# Patient Record
Sex: Male | Born: 1985 | Race: White | Hispanic: No | Marital: Single | Smoking: Never smoker
Health system: Southern US, Community
[De-identification: ages and names within clinical notes are randomized; demographics above are authoritative.]

## PROBLEM LIST (undated history)

## (undated) DIAGNOSIS — M24411 Recurrent dislocation, right shoulder: Secondary | ICD-10-CM

## (undated) DIAGNOSIS — F419 Anxiety disorder, unspecified: Secondary | ICD-10-CM

## (undated) DIAGNOSIS — I10 Essential (primary) hypertension: Secondary | ICD-10-CM

## (undated) DIAGNOSIS — F1910 Other psychoactive substance abuse, uncomplicated: Secondary | ICD-10-CM

## (undated) DIAGNOSIS — Z87442 Personal history of urinary calculi: Secondary | ICD-10-CM

## (undated) DIAGNOSIS — K219 Gastro-esophageal reflux disease without esophagitis: Secondary | ICD-10-CM

## (undated) DIAGNOSIS — M24111 Other articular cartilage disorders, right shoulder: Secondary | ICD-10-CM

## (undated) DIAGNOSIS — M75111 Incomplete rotator cuff tear or rupture of right shoulder, not specified as traumatic: Secondary | ICD-10-CM

## (undated) DIAGNOSIS — S43431A Superior glenoid labrum lesion of right shoulder, initial encounter: Secondary | ICD-10-CM

## (undated) HISTORY — PX: TONSILLECTOMY: SHX5217

## (undated) HISTORY — DX: Gastro-esophageal reflux disease without esophagitis: K21.9

## (undated) HISTORY — DX: Anxiety disorder, unspecified: F41.9

## (undated) HISTORY — DX: Other psychoactive substance abuse, uncomplicated: F19.10

## (undated) HISTORY — DX: Essential (primary) hypertension: I10

---
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
## 2011-01-04 ENCOUNTER — Ambulatory Visit (INDEPENDENT_AMBULATORY_CARE_PROVIDER_SITE_OTHER): Payer: 59

## 2011-01-04 ENCOUNTER — Inpatient Hospital Stay (INDEPENDENT_AMBULATORY_CARE_PROVIDER_SITE_OTHER)
Admission: RE | Admit: 2011-01-04 | Discharge: 2011-01-04 | Disposition: A | Discharge status: Home / Self Care | Payer: 59 | Source: Ambulatory Visit | Attending: Family Medicine | Admitting: Family Medicine

## 2011-01-04 DIAGNOSIS — N201 Calculus of ureter: Secondary | ICD-10-CM

## 2011-01-04 LAB — POCT URINALYSIS DIP (DEVICE)
Bilirubin Urine: NEGATIVE
Glucose, UA: NEGATIVE mg/dL
Ketones, ur: NEGATIVE mg/dL
Nitrite: NEGATIVE
Protein, ur: NEGATIVE mg/dL
Specific Gravity, Urine: 1.02 (ref 1.005–1.030)
Urobilinogen, UA: 0.2 mg/dL (ref 0.0–1.0)
pH: 6.5 (ref 5.0–8.0)

## 2011-08-23 ENCOUNTER — Other Ambulatory Visit: Payer: Self-pay

## 2011-08-23 ENCOUNTER — Emergency Department (HOSPITAL_COMMUNITY)
Admission: EM | Admit: 2011-08-23 | Discharge: 2011-08-24 | Payer: 59 | Attending: Emergency Medicine | Admitting: Emergency Medicine

## 2011-08-23 ENCOUNTER — Encounter: Payer: Self-pay | Admitting: Adult Health

## 2011-08-23 ENCOUNTER — Emergency Department (HOSPITAL_COMMUNITY): Payer: 59

## 2011-08-23 DIAGNOSIS — Z87442 Personal history of urinary calculi: Secondary | ICD-10-CM | POA: Insufficient documentation

## 2011-08-23 DIAGNOSIS — S60229A Contusion of unspecified hand, initial encounter: Secondary | ICD-10-CM

## 2011-08-23 DIAGNOSIS — Z79899 Other long term (current) drug therapy: Secondary | ICD-10-CM | POA: Insufficient documentation

## 2011-08-23 DIAGNOSIS — IMO0002 Reserved for concepts with insufficient information to code with codable children: Secondary | ICD-10-CM | POA: Insufficient documentation

## 2011-08-23 LAB — URINALYSIS, ROUTINE W REFLEX MICROSCOPIC
Glucose, UA: NEGATIVE mg/dL
Ketones, ur: NEGATIVE mg/dL
Leukocytes, UA: NEGATIVE
Protein, ur: NEGATIVE mg/dL
Urobilinogen, UA: 1 mg/dL (ref 0.0–1.0)

## 2011-08-23 LAB — ACETAMINOPHEN LEVEL: Acetaminophen (Tylenol), Serum: <15 ug/mL (ref 10–30)

## 2011-08-23 LAB — COMPREHENSIVE METABOLIC PANEL
ALT: 38 U/L (ref 0–53)
AST: 20 U/L (ref 0–37)
Albumin: 4.2 g/dL (ref 3.5–5.2)
Alkaline Phosphatase: 91 U/L (ref 39–117)
CO2: 25 mEq/L (ref 19–32)
Chloride: 103 mEq/L (ref 96–112)
Creatinine, Ser: 0.83 mg/dL (ref 0.50–1.35)
Potassium: 3.6 mEq/L (ref 3.5–5.1)
Sodium: 138 mEq/L (ref 135–145)
Total Bilirubin: 0.2 mg/dL — ABNORMAL LOW (ref 0.3–1.2)

## 2011-08-23 LAB — RAPID URINE DRUG SCREEN, HOSP PERFORMED
Amphetamines: NOT DETECTED
Benzodiazepines: NOT DETECTED
Tetrahydrocannabinol: POSITIVE — AB

## 2011-08-23 LAB — CBC
Platelets: 202 10*3/uL (ref 150–400)
RBC: 4.98 MIL/uL (ref 4.22–5.81)
RDW: 13.8 % (ref 11.5–15.5)
WBC: 10.1 10*3/uL (ref 4.0–10.5)

## 2011-08-23 NOTE — ED Notes (Signed)
Sent from [facility] with orders for hand x ray, and med clearance. To go back to [facility] after medically cleared. IVC papers.

## 2011-08-23 NOTE — ED Provider Notes (Signed)
History     CSN: [id number] Arrival date & time: 08/23/2011  9:02 PM   First MD Initiated Contact with Patient 08/23/11 2232      Chief Complaint  Patient presents with  . Medical Clearance  . Hand Pain    (Consider location/radiation/quality/duration/timing/severity/associated sxs/prior treatment) HPI Sent from [facility] with orders for hand x ray, and med clearance. To go back to [facility] after medically cleared. IVC papers.    Past Medical History  Diagnosis Date  . Kidney stone     History reviewed. No pertinent past surgical history.  History reviewed. No pertinent family history.  History  Substance Use Topics  . Smoking status: Never Smoker   . Smokeless tobacco: Not on file  . Alcohol Use: Yes      Review of Systems  Constitutional: Negative.   HENT: Negative.   Eyes: Negative.   Cardiovascular: Negative.   Gastrointestinal: Negative.   Hematological: Negative.     Allergies  Review of patient's allergies indicates no known allergies.  Home Medications   Current Outpatient Rx  Name Route Sig Dispense Refill  . ACETAMINOPHEN 325 MG PO TABS Oral Take 650 mg by mouth every 6 (six) hours as needed.      . GUAIFENESIN 600 MG PO TB12 Oral Take 600 mg by mouth 2 (two) times daily.        BP 152/81  Pulse 84  Temp(Src) 97.9 F (36.6 C) (Oral)  Resp 20  SpO2 98%  Physical Exam  Nursing note and vitals reviewed. Constitutional: He is oriented to person, place, and time. He appears well-developed and well-nourished. No distress.  HENT:  Head: Normocephalic and atraumatic.  Eyes: Pupils are equal, round, and reactive to light.  Neck: Normal range of motion.  Cardiovascular: Normal rate and intact distal pulses.   Pulmonary/Chest: No respiratory distress.  Abdominal: Normal appearance. He exhibits no distension.  Musculoskeletal: Normal range of motion.  Neurological: He is alert and oriented to person, place, and time. No cranial nerve deficit.    Skin: Skin is warm and dry. No rash noted.  Psychiatric: Judgment normal.    ED Course  Procedures (including critical care time)  Labs Reviewed  COMPREHENSIVE METABOLIC PANEL - Abnormal; Notable for the following:    Total Bilirubin 0.2 (*)    All other components within normal limits  URINE RAPID DRUG SCREEN (HOSP PERFORMED) - Abnormal; Notable for the following:    Tetrahydrocannabinol POSITIVE (*)    All other components within normal limits  URINALYSIS, ROUTINE W REFLEX MICROSCOPIC - Abnormal; Notable for the following:    Appearance CLOUDY (*)    All other components within normal limits  CBC  ETHANOL  ACETAMINOPHEN LEVEL   *RADIOLOGY REPORT*  Clinical Data: Hand struck wall, pain  RIGHT HAND - COMPLETE 3+ VIEW  Comparison: None  Findings: Small benign-appearing sclerotic focus at base of proximal phalanx index finger, question bone island. Osseous mineralization otherwise normal. Soft tissue swelling overlies the dorsum of the hand at the level of the mid to distal metacarpals. No acute fracture, dislocation, or bone destruction.  IMPRESSION: No acute osseous abnormalities.      1. Hand contusion       MDM          Nelia Shi, MD 08/24/11 [id number]

## 2011-08-23 NOTE — ED Notes (Signed)
Dr. Radford Pax to come see pt.

## 2012-03-28 IMAGING — CR DG HAND COMPLETE 3+V*R*
3 series · 3 of 3 positions shown · non-contrast
Comparison: None

CLINICAL DATA: Hand struck wall, pain

RIGHT HAND - COMPLETE 3+ VIEW

[x hand pa right]
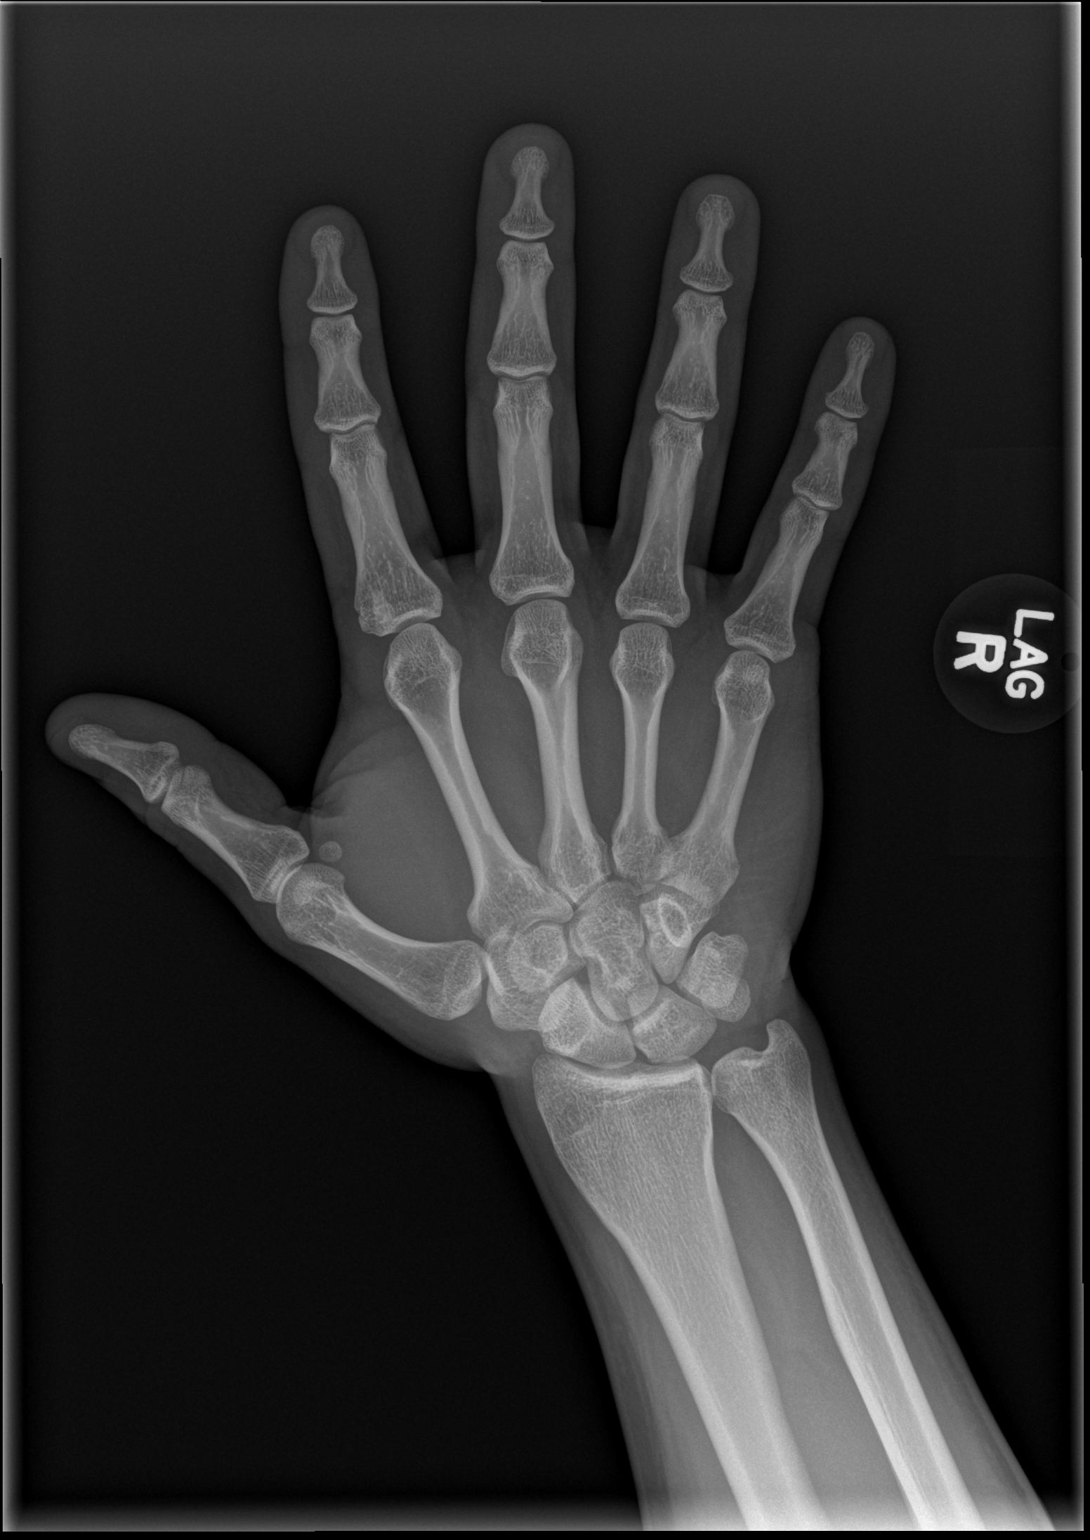

[x hand obl right]
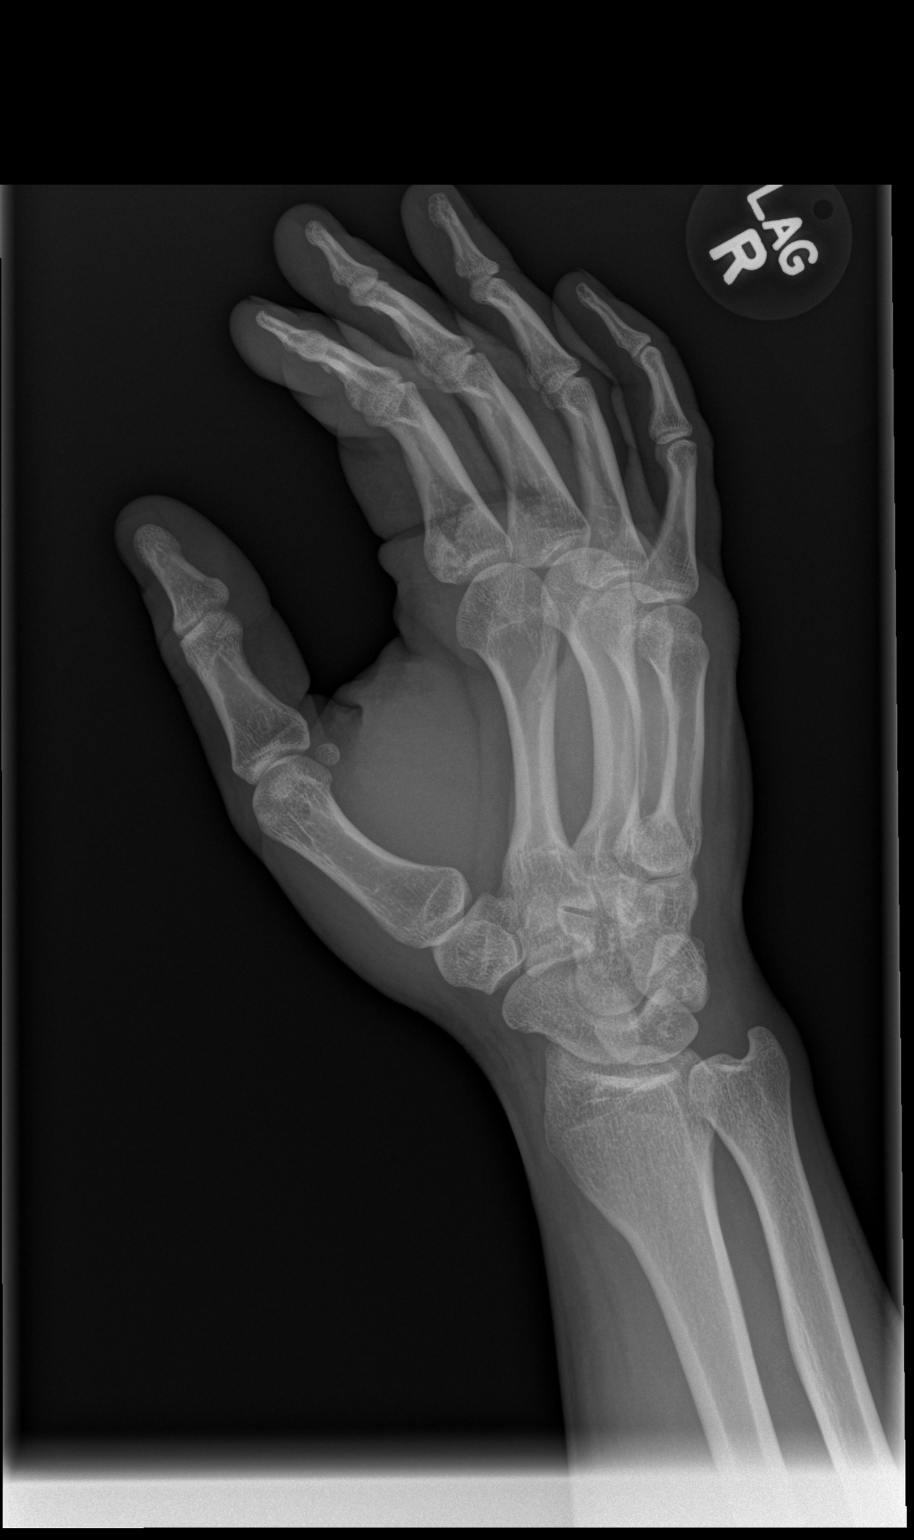

[x hand lat right]
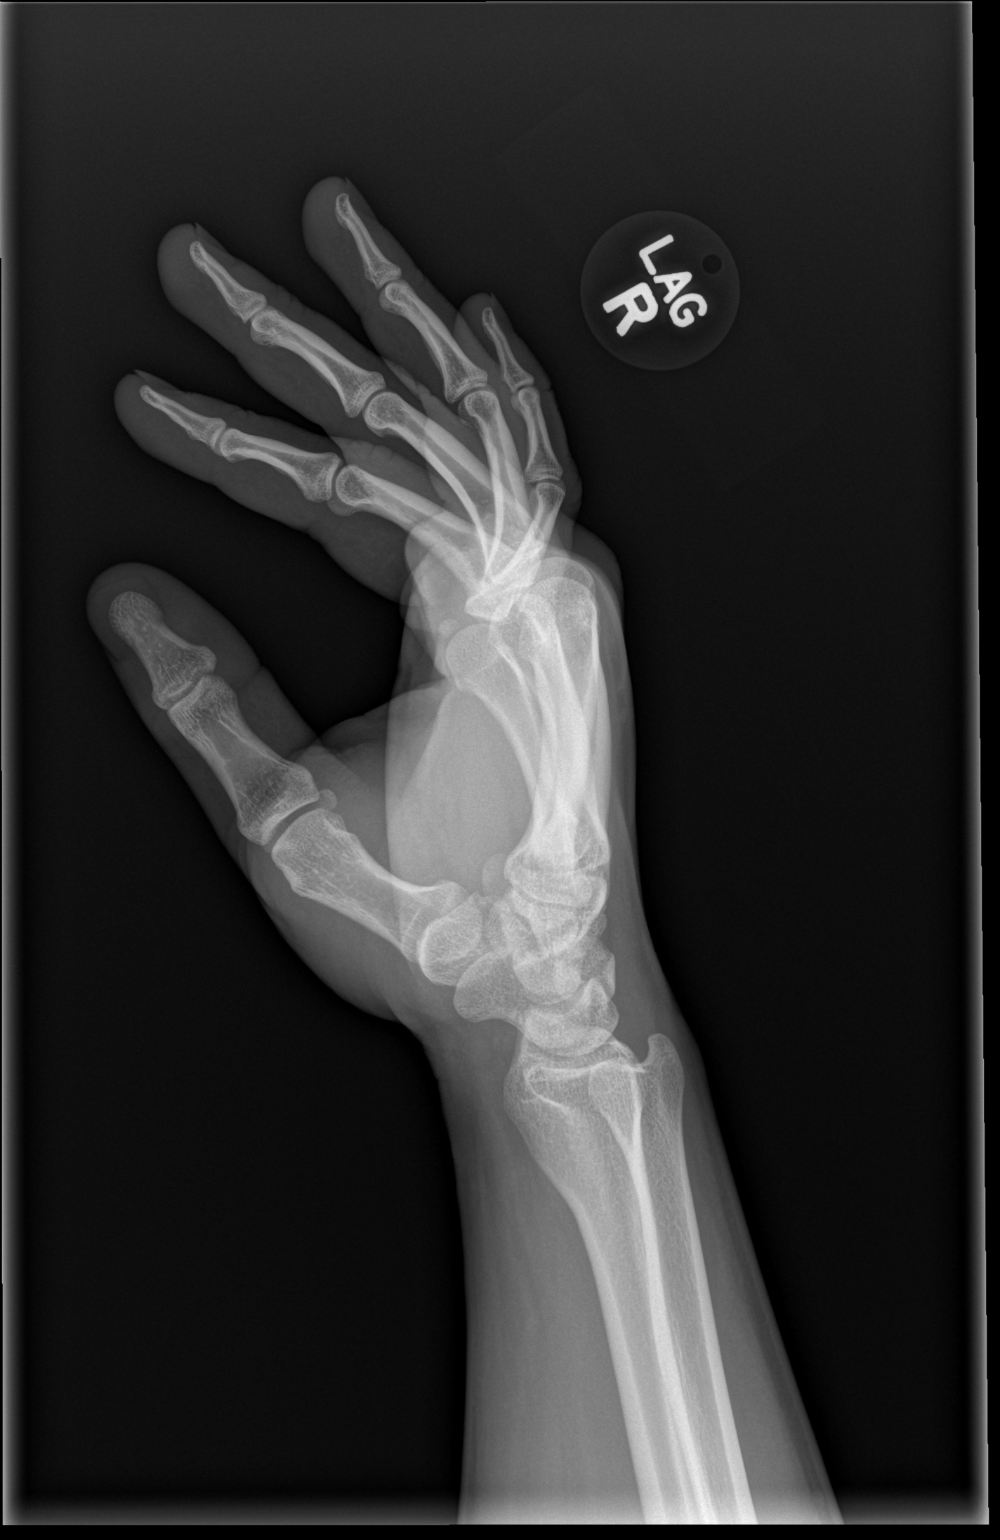

[3 of 3 positions shown; findings below may reference images not displayed]

FINDINGS: Small benign-appearing sclerotic focus at base of proximal phalanx
index finger, question bone island.
Osseous mineralization otherwise normal.
Soft tissue swelling overlies the dorsum of the hand at the level
of the mid to distal metacarpals.
No acute fracture, dislocation, or bone destruction.
IMPRESSION: No acute osseous abnormalities.

## 2017-08-30 DIAGNOSIS — M24411 Recurrent dislocation, right shoulder: Secondary | ICD-10-CM | POA: Diagnosis present

## 2017-08-30 DIAGNOSIS — M24111 Other articular cartilage disorders, right shoulder: Secondary | ICD-10-CM

## 2017-08-30 HISTORY — DX: Recurrent dislocation, right shoulder: M24.411

## 2017-08-30 HISTORY — DX: Other articular cartilage disorders, right shoulder: M24.111

## 2017-09-26 ENCOUNTER — Other Ambulatory Visit: Payer: Self-pay

## 2017-09-26 ENCOUNTER — Encounter (HOSPITAL_BASED_OUTPATIENT_CLINIC_OR_DEPARTMENT_OTHER): Payer: Self-pay | Admitting: *Deleted

## 2017-10-02 ENCOUNTER — Encounter (HOSPITAL_BASED_OUTPATIENT_CLINIC_OR_DEPARTMENT_OTHER): Payer: Self-pay | Admitting: Physician Assistant

## 2017-10-02 DIAGNOSIS — M75111 Incomplete rotator cuff tear or rupture of right shoulder, not specified as traumatic: Secondary | ICD-10-CM | POA: Diagnosis present

## 2017-10-02 DIAGNOSIS — S43431A Superior glenoid labrum lesion of right shoulder, initial encounter: Secondary | ICD-10-CM | POA: Diagnosis present

## 2017-10-02 NOTE — H&P (Signed)
William Villa is an 32 y.o. male.   Chief Complaint: right shoulder labral tear HPI:  Mr. William Villa is seen for a followup for significant right shoulder pain.  He saw Dr. Charlett BlakeVoytek ten days ago for significant right shoulder pain and a hand injury, but the shoulder has been bothering him for the past 8-10 months with repetitive lifting and pulling.  He is having sharp, stabbing pain in the shoulder.  He has tried rest and anti-inflammatories.  He has done a rehab program.  Despite this he has continued to have pain.  He underwent an MRI arthrogram of the right shoulder on 09/10/2017 that revealed a SLAP tear, as well as rotator cuff partial tear.  He has failed all conservative care.    Past Medical History:  Diagnosis Date  . 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 History of kidney stones   . 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 TONSILLECTOMY      History reviewed. No pertinent family history. Social History:  reports that  has never smoked. he has never used smokeless tobacco. He reports that he drinks alcohol. He reports that he uses drugs. Drug: Marijuana.  Allergies: No Known Allergies  No medications prior to admission.    No results found for this or any previous visit (from the past 48 hour(s)). No results found.  Review of Systems  Constitutional: Negative.   HENT: Negative.   Eyes: Negative.   Respiratory: Negative.   Cardiovascular: Negative.   Gastrointestinal: Negative.   Genitourinary: Negative.   Musculoskeletal: Positive for joint pain.  Skin: Negative.   Neurological: Negative.   Endo/Heme/Allergies: Negative.   Psychiatric/Behavioral: Negative.     Height 6\' 1"  (1.854 m), weight 99.8 kg (220 lb). Physical Exam  Constitutional: He is oriented to person, place, and time. He appears well-developed and well-nourished.  HENT:  Head: Normocephalic and atraumatic.  Eyes:  Conjunctivae are normal. Pupils are equal, round, and reactive to light.  Neck: Neck supple.  Cardiovascular: Normal rate.  Respiratory: Effort normal.  GI: Soft.  Musculoskeletal:  Examination of his right shoulder reveals 75% range of motion with pain.  Weakness on rotator cuff stressing with pain.  Positive O'Brien's test.  Positive apprehension.  Examination of his left shoulder reveals full range of motion without pain, weakness or instability.  Pulses 2+ and symmetric.  Neurologic exam reveals distal motor and sensory examination is within normal limits.    Neurological: He is alert and oriented to person, place, and time.  Skin: Skin is warm and dry.  Psychiatric: He has a normal mood and affect.     Assessment Principal Problem:   Type 1 superior labral anterior-to-posterior (SLAP) tear of right shoulder Active Problems:   Recurrent dislocation of right shoulder   Articular cartilage disorder of shoulder, right   Partial tear of right rotator cuff  Plan At this point due to his significant pain and lack of response to conservative care, we would recommend he proceed with right shoulder arthroscopy with SLAP repair and labral repair, as well as partial rotator cuff tear debridement.  Risks, complications and benefits of surgery are described to him in detail and he understands this completely.    Pascal LuxSHEPPERSON,Larkin Alfred J, PA-C 10/02/2017, 11:06 AM

## 2017-10-03 ENCOUNTER — Encounter (HOSPITAL_BASED_OUTPATIENT_CLINIC_OR_DEPARTMENT_OTHER): Payer: Self-pay | Admitting: *Deleted

## 2017-10-03 ENCOUNTER — Other Ambulatory Visit: Payer: Self-pay

## 2017-10-03 ENCOUNTER — Ambulatory Visit (HOSPITAL_BASED_OUTPATIENT_CLINIC_OR_DEPARTMENT_OTHER)
Admission: RE | Admit: 2017-10-03 | Discharge: 2017-10-03 | Disposition: A | Discharge status: Home / Self Care | Payer: 59 | Source: Ambulatory Visit | Attending: Orthopedic Surgery | Admitting: Orthopedic Surgery

## 2017-10-03 ENCOUNTER — Encounter (HOSPITAL_BASED_OUTPATIENT_CLINIC_OR_DEPARTMENT_OTHER)
Admission: RE | Disposition: A | Discharge status: Home / Self Care | Payer: Self-pay | Source: Ambulatory Visit | Attending: Orthopedic Surgery

## 2017-10-03 ENCOUNTER — Ambulatory Visit (HOSPITAL_BASED_OUTPATIENT_CLINIC_OR_DEPARTMENT_OTHER): Payer: 59 | Admitting: Anesthesiology

## 2017-10-03 DIAGNOSIS — M75111 Incomplete rotator cuff tear or rupture of right shoulder, not specified as traumatic: Secondary | ICD-10-CM | POA: Diagnosis present

## 2017-10-03 DIAGNOSIS — X58XXXA Exposure to other specified factors, initial encounter: Secondary | ICD-10-CM | POA: Diagnosis not present

## 2017-10-03 DIAGNOSIS — S43431A Superior glenoid labrum lesion of right shoulder, initial encounter: Secondary | ICD-10-CM | POA: Diagnosis not present

## 2017-10-03 DIAGNOSIS — M24411 Recurrent dislocation, right shoulder: Secondary | ICD-10-CM | POA: Diagnosis not present

## 2017-10-03 DIAGNOSIS — S43491A Other sprain of right shoulder joint, initial encounter: Secondary | ICD-10-CM | POA: Diagnosis not present

## 2017-10-03 DIAGNOSIS — M24111 Other articular cartilage disorders, right shoulder: Secondary | ICD-10-CM | POA: Diagnosis present

## 2017-10-03 HISTORY — DX: Superior glenoid labrum lesion of right shoulder, initial encounter: S43.431A

## 2017-10-03 HISTORY — DX: Personal history of urinary calculi: Z87.442

## 2017-10-03 HISTORY — DX: Incomplete rotator cuff tear or rupture of right shoulder, not specified as traumatic: M75.111

## 2017-10-03 HISTORY — DX: Recurrent dislocation, right shoulder: M24.411

## 2017-10-03 HISTORY — PX: SHOULDER ARTHROSCOPY WITH CAPSULORRHAPHY: SHX6454

## 2017-10-03 HISTORY — DX: Other articular cartilage disorders, right shoulder: M24.111

## 2017-10-03 SURGERY — SHOULDER ATHROSCOPY WITH CAPSULORRHAPHY
Anesthesia: General | Site: Shoulder | Laterality: Right

## 2017-10-03 MED ORDER — FENTANYL CITRATE (PF) 100 MCG/2ML IJ SOLN
INTRAMUSCULAR | Status: AC
Start: 2017-10-03 — End: 2017-10-03
  Filled 2017-10-03: qty 2

## 2017-10-03 MED ORDER — ARTIFICIAL TEARS OPHTHALMIC OINT
TOPICAL_OINTMENT | OPHTHALMIC | Status: AC
  Filled 2017-10-03: qty 3.5

## 2017-10-03 MED ORDER — ROCURONIUM BROMIDE 10 MG/ML (PF) SYRINGE
PREFILLED_SYRINGE | INTRAVENOUS | Status: AC
  Filled 2017-10-03: qty 5

## 2017-10-03 MED ORDER — CEFAZOLIN SODIUM-DEXTROSE 2-4 GM/100ML-% IV SOLN
2.0000 g | INTRAVENOUS | Status: AC
  Administered 2017-10-03: 2 g via INTRAVENOUS

## 2017-10-03 MED ORDER — ROCURONIUM BROMIDE 10 MG/ML (PF) SYRINGE
PREFILLED_SYRINGE | INTRAVENOUS | Status: DC | PRN
  Administered 2017-10-03: 40 mg via INTRAVENOUS
  Administered 2017-10-03: 20 mg via INTRAVENOUS

## 2017-10-03 MED ORDER — ONDANSETRON HCL 4 MG/2ML IJ SOLN
INTRAMUSCULAR | Status: DC | PRN
  Administered 2017-10-03: 4 mg via INTRAVENOUS

## 2017-10-03 MED ORDER — CEFAZOLIN SODIUM-DEXTROSE 2-4 GM/100ML-% IV SOLN
INTRAVENOUS | Status: AC
  Filled 2017-10-03: qty 100

## 2017-10-03 MED ORDER — LACTATED RINGERS IV SOLN: INTRAVENOUS | Status: DC

## 2017-10-03 MED ORDER — POVIDONE-IODINE 7.5 % EX SOLN: Freq: Once | CUTANEOUS | Status: DC

## 2017-10-03 MED ORDER — ONDANSETRON HCL 4 MG/2ML IJ SOLN
INTRAMUSCULAR | Status: AC
  Filled 2017-10-03: qty 2

## 2017-10-03 MED ORDER — ROPIVACAINE HCL 7.5 MG/ML IJ SOLN
INTRAMUSCULAR | Status: DC | PRN
  Administered 2017-10-03: 20 mL via PERINEURAL

## 2017-10-03 MED ORDER — LIDOCAINE 2% (20 MG/ML) 5 ML SYRINGE
INTRAMUSCULAR | Status: AC
  Filled 2017-10-03: qty 5

## 2017-10-03 MED ORDER — MEPERIDINE HCL 25 MG/ML IJ SOLN: 6.2500 mg | INTRAMUSCULAR | Status: DC | PRN

## 2017-10-03 MED ORDER — PROMETHAZINE HCL 25 MG/ML IJ SOLN
INTRAMUSCULAR | Status: AC
  Filled 2017-10-03: qty 1

## 2017-10-03 MED ORDER — PROPOFOL 10 MG/ML IV BOLUS
INTRAVENOUS | Status: DC | PRN
  Administered 2017-10-03: 50 mg via INTRAVENOUS
  Administered 2017-10-03: 200 mg via INTRAVENOUS
  Administered 2017-10-03: 50 mg via INTRAVENOUS

## 2017-10-03 MED ORDER — PROMETHAZINE HCL 25 MG/ML IJ SOLN
6.2500 mg | INTRAMUSCULAR | Status: DC | PRN
  Administered 2017-10-03: 6.25 mg via INTRAVENOUS

## 2017-10-03 MED ORDER — SUGAMMADEX SODIUM 200 MG/2ML IV SOLN
INTRAVENOUS | Status: DC | PRN
  Administered 2017-10-03: 200 mg via INTRAVENOUS

## 2017-10-03 MED ORDER — MIDAZOLAM HCL 2 MG/2ML IJ SOLN
INTRAMUSCULAR | Status: AC
  Filled 2017-10-03: qty 2

## 2017-10-03 MED ORDER — LIDOCAINE 2% (20 MG/ML) 5 ML SYRINGE
INTRAMUSCULAR | Status: DC | PRN
  Administered 2017-10-03: 100 mg via INTRAVENOUS

## 2017-10-03 MED ORDER — LACTATED RINGERS IV SOLN
INTRAVENOUS | Status: DC
  Administered 2017-10-03 (×2): via INTRAVENOUS

## 2017-10-03 MED ORDER — FENTANYL CITRATE (PF) 100 MCG/2ML IJ SOLN
INTRAMUSCULAR | Status: AC
  Filled 2017-10-03: qty 2

## 2017-10-03 MED ORDER — SUGAMMADEX SODIUM 200 MG/2ML IV SOLN
INTRAVENOUS | Status: AC
  Filled 2017-10-03: qty 2

## 2017-10-03 MED ORDER — SODIUM CHLORIDE 0.9 % IJ SOLN
INTRAMUSCULAR | Status: AC
  Filled 2017-10-03: qty 10

## 2017-10-03 MED ORDER — DEXAMETHASONE SODIUM PHOSPHATE 4 MG/ML IJ SOLN
INTRAMUSCULAR | Status: DC | PRN
  Administered 2017-10-03: 10 mg via INTRAVENOUS

## 2017-10-03 MED ORDER — OXYCODONE HCL 5 MG PO TABS: 5.0000 mg | ORAL_TABLET | Freq: Once | ORAL | Status: DC | PRN

## 2017-10-03 MED ORDER — PROPOFOL 10 MG/ML IV BOLUS
INTRAVENOUS | Status: AC
  Filled 2017-10-03: qty 20

## 2017-10-03 MED ORDER — HYDROMORPHONE HCL 1 MG/ML IJ SOLN: 0.2500 mg | INTRAMUSCULAR | Status: DC | PRN

## 2017-10-03 MED ORDER — SCOPOLAMINE 1 MG/3DAYS TD PT72: 1.0000 | MEDICATED_PATCH | Freq: Once | TRANSDERMAL | Status: DC | PRN

## 2017-10-03 MED ORDER — FENTANYL CITRATE (PF) 100 MCG/2ML IJ SOLN
50.0000 ug | INTRAMUSCULAR | Status: DC | PRN
  Administered 2017-10-03 (×2): 100 ug via INTRAVENOUS

## 2017-10-03 MED ORDER — MIDAZOLAM HCL 2 MG/2ML IJ SOLN
1.0000 mg | INTRAMUSCULAR | Status: DC | PRN
  Administered 2017-10-03 (×2): 2 mg via INTRAVENOUS

## 2017-10-03 MED ORDER — OXYCODONE HCL 5 MG PO TABS
ORAL_TABLET | ORAL | 0 refills | Status: DC
Start: 2017-10-03 — End: 2019-10-29

## 2017-10-03 MED ORDER — CYCLOBENZAPRINE HCL 5 MG PO TABS: ORAL_TABLET | ORAL | 0 refills | Status: DC

## 2017-10-03 MED ORDER — KETOROLAC TROMETHAMINE 30 MG/ML IJ SOLN: 30.0000 mg | Freq: Once | INTRAMUSCULAR | Status: DC | PRN

## 2017-10-03 MED ORDER — DEXAMETHASONE SODIUM PHOSPHATE 10 MG/ML IJ SOLN
INTRAMUSCULAR | Status: AC
  Filled 2017-10-03: qty 1

## 2017-10-03 MED ORDER — OXYCODONE HCL 5 MG/5ML PO SOLN: 5.0000 mg | Freq: Once | ORAL | Status: DC | PRN

## 2017-10-03 SURGICAL SUPPLY — 72 items
ANCH SUT SHRT 12.5 CANN EYLT (Anchor) ×4 IMPLANT
ANCHOR SUT BIOCOMP LK 2.9X12.5 (Anchor) ×12 IMPLANT
BLADE CLIPPER SURG (BLADE) IMPLANT
BLADE CUDA 5.5 (BLADE) IMPLANT
BLADE CUTTER GATOR 3.5 (BLADE) ×3 IMPLANT
BLADE GREAT WHITE 4.2 (BLADE) IMPLANT
BLADE GREAT WHITE 4.2MM (BLADE)
BLADE SURG 15 STRL LF DISP TIS (BLADE) IMPLANT
BLADE SURG 15 STRL SS (BLADE)
BNDG COHESIVE 4X5 TAN STRL (GAUZE/BANDAGES/DRESSINGS) ×3 IMPLANT
BUR OVAL 6.0 (BURR) IMPLANT
CANNULA TWIST IN 8.25X7CM (CANNULA) ×6 IMPLANT
DECANTER SPIKE VIAL GLASS SM (MISCELLANEOUS) IMPLANT
DRAPE SHOULDER BEACH CHAIR (DRAPES) ×3 IMPLANT
DRAPE U-SHAPE 47X51 STRL (DRAPES) ×6 IMPLANT
DRSG PAD ABDOMINAL 8X10 ST (GAUZE/BANDAGES/DRESSINGS) ×3 IMPLANT
DURAPREP 26ML APPLICATOR (WOUND CARE) ×3 IMPLANT
ELECT REM PT RETURN 9FT ADLT (ELECTROSURGICAL)
ELECTRODE REM PT RTRN 9FT ADLT (ELECTROSURGICAL) IMPLANT
GAUZE SPONGE 4X4 12PLY STRL (GAUZE/BANDAGES/DRESSINGS) ×3 IMPLANT
GAUZE XEROFORM 1X8 LF (GAUZE/BANDAGES/DRESSINGS) ×3 IMPLANT
GLOVE BIO SURGEON STRL SZ 6.5 (GLOVE) ×2 IMPLANT
GLOVE BIO SURGEON STRL SZ7 (GLOVE) ×6 IMPLANT
GLOVE BIO SURGEONS STRL SZ 6.5 (GLOVE) ×1
GLOVE BIOGEL PI IND STRL 7.0 (GLOVE) ×1 IMPLANT
GLOVE BIOGEL PI IND STRL 7.5 (GLOVE) ×1 IMPLANT
GLOVE BIOGEL PI INDICATOR 7.0 (GLOVE) ×2
GLOVE BIOGEL PI INDICATOR 7.5 (GLOVE) ×2
GLOVE ECLIPSE 6.5 STRL STRAW (GLOVE) ×3 IMPLANT
GLOVE SS BIOGEL STRL SZ 7.5 (GLOVE) ×1 IMPLANT
GLOVE SUPERSENSE BIOGEL SZ 7.5 (GLOVE) ×2
GOWN STRL REUS W/ TWL LRG LVL3 (GOWN DISPOSABLE) ×3 IMPLANT
GOWN STRL REUS W/ TWL XL LVL3 (GOWN DISPOSABLE) ×1 IMPLANT
GOWN STRL REUS W/TWL LRG LVL3 (GOWN DISPOSABLE) ×9
GOWN STRL REUS W/TWL XL LVL3 (GOWN DISPOSABLE) ×3
KIT PUSHLOCK 2.9 HIP (KITS) ×3 IMPLANT
LASSO 90 CVE QUICKPAS (DISPOSABLE) ×3 IMPLANT
LASSO CRESCENT QUICKPASS (SUTURE) ×3 IMPLANT
LOOP 2 FIBERLINK CLOSED (SUTURE) IMPLANT
MANIFOLD NEPTUNE II (INSTRUMENTS) ×3 IMPLANT
NDL SAFETY ECLIPSE 18X1.5 (NEEDLE) IMPLANT
NEEDLE HYPO 18GX1.5 SHARP (NEEDLE)
PACK ARTHROSCOPY DSU (CUSTOM PROCEDURE TRAY) ×3 IMPLANT
PACK BASIN DAY SURGERY FS (CUSTOM PROCEDURE TRAY) ×3 IMPLANT
PAD ALCOHOL SWAB (MISCELLANEOUS) IMPLANT
PAD ORTHO SHOULDER 7X19 LRG (SOFTGOODS) ×3 IMPLANT
SLEEVE SCD COMPRESS KNEE MED (MISCELLANEOUS) IMPLANT
SLING ARM FOAM STRAP LRG (SOFTGOODS) IMPLANT
SLING ARM IMMOBILIZER MED (SOFTGOODS) IMPLANT
SLING ARM MED ADULT FOAM STRAP (SOFTGOODS) IMPLANT
SLING ARM XL FOAM STRAP (SOFTGOODS) IMPLANT
SLING ULTRA III MED (ORTHOPEDIC SUPPLIES) IMPLANT
SUCTION FRAZIER HANDLE 10FR (MISCELLANEOUS)
SUCTION TUBE FRAZIER 10FR DISP (MISCELLANEOUS) IMPLANT
SUT ETHILON 3 0 PS 1 (SUTURE) ×3 IMPLANT
SUT FIBERWIRE #2 38 T-5 BLUE (SUTURE)
SUT LASSO 45 DEG R (SUTURE) IMPLANT
SUT PDS AB 2-0 CT2 27 (SUTURE) IMPLANT
SUT PROLENE 3 0 PS 2 (SUTURE) IMPLANT
SUTURE FIBERWR #2 38 T-5 BLUE (SUTURE) IMPLANT
SYR 5ML LL (SYRINGE) ×3 IMPLANT
TAPE HYPAFIX 6 X30' (GAUZE/BANDAGES/DRESSINGS)
TAPE HYPAFIX 6X30 (GAUZE/BANDAGES/DRESSINGS) IMPLANT
TAPE LABRALWHITE 1.5X36 (TAPE) ×3 IMPLANT
TAPE STRIPS DRAPE STRL (GAUZE/BANDAGES/DRESSINGS) ×3 IMPLANT
TAPE SUT LABRALTAP WHT/BLK (SUTURE) IMPLANT
TOWEL OR 17X24 6PK STRL BLUE (TOWEL DISPOSABLE) ×3 IMPLANT
TUBE CONNECTING 20'X1/4 (TUBING) ×1
TUBE CONNECTING 20X1/4 (TUBING) ×2 IMPLANT
TUBING ARTHRO INFLOW-ONLY STRL (TUBING) IMPLANT
WAND STAR VAC 90 (SURGICAL WAND) IMPLANT
WATER STERILE IRR 1000ML POUR (IV SOLUTION) ×3 IMPLANT

## 2017-10-03 NOTE — Transfer of Care (Signed)
Immediate Anesthesia Transfer of Care Note  Patient: William Villa  Procedure(s) Performed: SHOULDER ATHROSCOPY WITH CAPSULORRHAPHY, REPAIR SLAP LESION AND DEBRIDEMENT (Right Shoulder)  Patient Location: PACU  Anesthesia Type:GA combined with regional for post-op pain  Level of Consciousness: awake, sedated and patient cooperative  Airway & Oxygen Therapy: Patient Spontanous Breathing and Patient connected to face mask oxygen  Post-op Assessment: Report given to RN and Post -op Vital signs reviewed and stable  Post vital signs: Reviewed and stable  Last Vitals:  Vitals:   10/03/17 1243 10/03/17 1539  BP:  (!) 156/99  Pulse: 89 93  Resp: 19 (!) 21  Temp:    SpO2: 100% 94%    Last Pain:  Vitals:   10/03/17 1202  TempSrc: Oral  PainSc:          Complications: No apparent anesthesia complications

## 2017-10-03 NOTE — OR Nursing (Signed)
Operation booked as incorrect side was noted in preop to be right shoulder however not changed in epic until after timeouts done right shoulder was timed out again after change made in epic

## 2017-10-03 NOTE — Anesthesia Postprocedure Evaluation (Signed)
Anesthesia Post Note  Patient: William Villa  Procedure(s) Performed: SHOULDER ATHROSCOPY WITH CAPSULORRHAPHY, REPAIR SLAP LESION AND DEBRIDEMENT (Right Shoulder)     Patient location during evaluation: PACU Anesthesia Type: General Level of consciousness: awake and alert Pain management: pain level controlled Vital Signs Assessment: post-procedure vital signs reviewed and stable Respiratory status: spontaneous breathing, nonlabored ventilation, respiratory function stable and patient connected to nasal cannula oxygen Cardiovascular status: blood pressure returned to baseline and stable Postop Assessment: no apparent nausea or vomiting Anesthetic complications: no    Last Vitals:  Vitals:   10/03/17 1545 10/03/17 1600  BP: (!) 156/85 (!) 145/84  Pulse: 94 84  Resp: (!) 26 15  Temp:    SpO2: 99% 96%    Last Pain:  Vitals:   10/03/17 1600  TempSrc:   PainSc: 0-No pain                 Beryle Lathehomas E Brock

## 2017-10-03 NOTE — Anesthesia Procedure Notes (Signed)
Procedure Name: Intubation Date/Time: 10/03/2017 2:08 PM Performed by: Gar GibbonKeeton, Nina Mondor S, CRNA Pre-anesthesia Checklist: Patient identified, Emergency Drugs available, Suction available and Patient being monitored Patient Re-evaluated:Patient Re-evaluated prior to induction Oxygen Delivery Method: Circle system utilized Preoxygenation: Pre-oxygenation with 100% oxygen Induction Type: IV induction Ventilation: Mask ventilation without difficulty Laryngoscope Size: Miller and 3 Grade View: Grade II Tube type: Oral Tube size: 8.0 mm Number of attempts: 2 Airway Equipment and Method: Stylet and Oral airway Placement Confirmation: ETT inserted through vocal cords under direct vision,  positive ETCO2 and breath sounds checked- equal and bilateral Secured at: 22 cm Tube secured with: Tape Dental Injury: Teeth and Oropharynx as per pre-operative assessment  Difficulty Due To: Difficult Airway- due to anterior larynx and Difficulty was anticipated

## 2017-10-03 NOTE — Discharge Instructions (Signed)

## 2017-10-03 NOTE — Anesthesia Procedure Notes (Signed)
Anesthesia Regional Block: Interscalene brachial plexus block   Pre-Anesthetic Checklist: ,, timeout performed, Correct Patient, Correct Site, Correct Laterality, Correct Procedure, Correct Position, site marked, Risks and benefits discussed,  Surgical consent,  Pre-op evaluation,  At surgeon's request and post-op pain management  Laterality: Right  Prep: chloraprep       Needles:  Injection technique: Single-shot  Needle Type: Stimulator Needle - 40     Needle Length: 4cm  Needle Gauge: 22     Additional Needles:   Procedures:,,,, ultrasound used (permanent image in chart),,,,  Narrative:  Start time: 10/03/2017 12:40 PM End time: 10/03/2017 12:42 PM Injection made incrementally with aspirations every 5 mL. Anesthesiologist: Lewie LoronGermeroth, Fritz Cauthon, MD  Additional Notes: BP cuff, EKG monitors applied. Sedation begun. Nerve location verified with U/S. Anesthetic injected incrementally, slowly , and after neg aspirations under direct u/s guidance. Good perineural spread. Tolerated well.

## 2017-10-03 NOTE — Anesthesia Preprocedure Evaluation (Signed)
Anesthesia Evaluation  Patient identified by MRN, date of birth, ID band Patient awake    Reviewed: Allergy & Precautions, NPO status , Patient's Chart, lab work & pertinent test results  Airway Mallampati: I  TM Distance: >3 FB Neck ROM: Full    Dental no notable dental hx.    Pulmonary neg pulmonary ROS,    Pulmonary exam normal breath sounds clear to auscultation       Cardiovascular negative cardio ROS Normal cardiovascular exam Rhythm:Regular Rate:Normal     Neuro/Psych negative neurological ROS  negative psych ROS   GI/Hepatic negative GI ROS, Neg liver ROS,   Endo/Other  negative endocrine ROS  Renal/GU negative Renal ROS     Musculoskeletal negative musculoskeletal ROS (+)   Abdominal   Peds  Hematology negative hematology ROS (+)   Anesthesia Other Findings   Reproductive/Obstetrics                             Anesthesia Physical Anesthesia Plan  ASA: I  Anesthesia Plan: General   Post-op Pain Management: GA combined w/ Regional for post-op pain   Induction: Intravenous  PONV Risk Score and Plan: 2 and Ondansetron, Dexamethasone and Midazolam  Airway Management Planned: Oral ETT  Additional Equipment:   Intra-op Plan:   Post-operative Plan: Extubation in OR  Informed Consent: I have reviewed the patients History and Physical, chart, labs and discussed the procedure including the risks, benefits and alternatives for the proposed anesthesia with the patient or authorized representative who has indicated his/her understanding and acceptance.   Dental advisory given  Plan Discussed with: CRNA  Anesthesia Plan Comments:         Anesthesia Quick Evaluation

## 2017-10-03 NOTE — Interval H&P Note (Signed)
History and Physical Interval Note:  10/03/2017 1:50 PM  William Villa  has presented today for surgery, with the diagnosis of RIGHT SHOULDER CARTILAGE DISORDERS, RECURRENT DISLOCATION  The various methods of treatment have been discussed with the patient and family. After consideration of risks, benefits and other options for treatment, the patient has consented to  Procedure(s): SHOULDER ATHROSCOPY WITH CAPSULORRHAPHY, REPAIR SLAP LESION AND DEBRIDEMENT (Left) as a surgical intervention .  The patient's history has been reviewed, patient examined, no change in status, stable for surgery.  I have reviewed the patient's chart and labs.  Questions were answered to the patient's satisfaction.     Nilda Simmerobert A Hajira Verhagen

## 2017-10-03 NOTE — Progress Notes (Signed)
Assisted Dr. Germeroth with right, ultrasound guided, interscalene  block. Side rails up, monitors on throughout procedure. See vital signs in flow sheet. Tolerated Procedure well.  

## 2017-10-06 ENCOUNTER — Encounter (HOSPITAL_BASED_OUTPATIENT_CLINIC_OR_DEPARTMENT_OTHER): Payer: Self-pay | Admitting: Orthopedic Surgery

## 2017-10-06 NOTE — Op Note (Signed)
NAMEDAMION, KANT                ACCOUNT NO.:  [id number]  MEDICAL RECORD NO.:  [id number]  LOCATION:                                 FACILITY:  PHYSICIAN:  Gaylen Pereira A. Thurston Hole, M.D.      DATE OF BIRTH:  DATE OF PROCEDURE:  10/03/2017 DATE OF DISCHARGE:                              OPERATIVE REPORT   PREOPERATIVE DIAGNOSES: 1. 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 2. Right shoulder chronic traumatic posterior and inferior Bankart     tear. 3. Right shoulder chronic traumatic partial rotator cuff tear.  POSTOPERATIVE DIAGNOSES: 1. 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 2. Right shoulder chronic traumatic posterior and inferior Bankart     tear. 3. Right shoulder chronic traumatic partial rotator cuff tear.  PROCEDURES: 1. Right shoulder examination under anesthesia followed by     arthroscopic debridement, partial rotator cuff tear and partial     anterior labrum tear - extensive. 2. Right shoulder superior labrum anterior and posterior repair using     Arthrex PushLock anchors x2. 3. Right shoulder posterior and inferior Bankart repair using Arthrex     PushLock anchors x2.  SURGEON:  Elana Alm. Thurston Hole, MD.  ASSISTANTJulien Girt, PA.  ANESTHESIA:  General.  OPERATIVE TIME:  1 hour.  COMPLICATIONS:  None.  INDICATION FOR PROCEDURE:  William Villa is a 32 year old who injured his right shoulder approximately 2 years ago with a dislocation event.  Exam and MRI have revealed a posterior and inferior Bankart tear as well as a separate SLAP tear and a partial rotator cuff tear.  He has failed multiple conservative modalities and is now to undergo arthroscopy, debridement, and repair.  DESCRIPTION OF PROCEDURE:  William Villa was brought into the operating room on October 03, 2017, after an interscalene block was placed in the holding room by Anesthesia.  He was placed on the operative  table in supine position.  He received antibiotics preoperatively for prophylaxis.  After being placed under general anesthesia, his right shoulder was examined.  He had full range of motion with posterior and inferior instability on the right, which he did not have on the left. No anterior instability.  He was then placed in beach chair position and shoulder and arm were prepped using sterile DuraPrep and draped using sterile technique.  A time-out procedure was called and the correct right shoulder identified.  Initially, through a posterior arthroscopic portal, the arthroscope with a pump attached was placed into an anterior portal and arthroscopic probe was placed.  On initial inspection, the articular cartilage in the glenohumeral joint was intact.  The anterior inferior labrum was intact and the anterior inferior glenohumeral ligament complex was intact, but the superior labrum was completely torn and detached off its superior glenoid rim from the anterior position to the 2 o'clock position anteriorly and then to the 10 o'clock position posteriorly.  There was a separate noncontiguous posterior inferior labrum tear from the 6 o'clock position to the 9 o'clock position on the posterior inferior glenoid rim.  The rotator cuff was inspected.  He had a partial  tear of the supraspinatus 25-30%, which was debrided.  The rest of the rotator cuff was intact.  Inferior capsular recess, otherwise, free of pathology.  Biceps tendon was intact.  At this point, through an accessory posterior superior lateral portal.  Initially, the posterior Bankart repair was repaired primarily with 2 separate Arthrex PushLock anchors and LabralTape using mattress suture technique, one in the 7 o'clock and one in the 9 o'clock position with firm and tight fixation.  After this was done, then the superior labrum tear was repaired primarily with two separate PushLock anchors, one in the 11:30 position posterior  superior and one in the 10:30 position posterior superior, each with the LabralTape with firm and tight fixation.  The anterior portion of the SLAP repair was then debrided and the rest of the labrum was found to be stable and the repairs were found to be stable with excellent restoration of normal anatomy.  At this point, I felt that all pathology had been satisfactorily addressed.  The instruments were removed.  Portals were closed with 3-0 nylon suture. Sterile dressings and a sling applied and the patient awakened and taken to the recovery room in stable condition.  FOLLOWUP CARE:  William LangCameron will be followed as an outpatient, on oxycodone and Flexeril and an abduction sling.  He will be seen back in office in a week for sutures out and followup.     William Villa A. Thurston HoleWainer, M.D.   ______________________________ Elana Almobert A. Thurston HoleWainer, M.D.    RAW/MEDQ  D:  10/03/2017  T:  10/03/2017  Job:  [id number]

## 2019-01-13 ENCOUNTER — Ambulatory Visit (INDEPENDENT_AMBULATORY_CARE_PROVIDER_SITE_OTHER): Payer: 59 | Admitting: Gastroenterology

## 2019-01-13 ENCOUNTER — Encounter: Payer: Self-pay | Admitting: Gastroenterology

## 2019-01-13 ENCOUNTER — Other Ambulatory Visit: Payer: Self-pay

## 2019-01-13 DIAGNOSIS — K219 Gastro-esophageal reflux disease without esophagitis: Secondary | ICD-10-CM

## 2019-01-13 NOTE — Progress Notes (Deleted)
This patient contacted our office requesting a physician telemedicine video consultation regarding clinical questions and/or test results.  If new patient, they were referred by ***  Participants on the *** : ***   The patient consented to phone consultation and was aware that a charge will be placed through their insurance.  I was in my office and the patient was ***  ***  Encounter time:  Total time *** minutes, with *** minutes spent with patient on phone/webex    Amada Jupiter, MD   _____________________________________________________________________________________________              [facility] Consult Note:  History: William Villa 01/13/2019  Referring physician: Patient, No Pcp Per  Reason for consult/chief complaint: No chief complaint on file.   Subjective  HPI:  ***   ROS:  Review of Systems   Past Medical History: Past Medical History:  Diagnosis Date  . 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 History of kidney stones   . Partial tear of right rotator cuff   . 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 Type 1 superior labral anterior-to-posterior (SLAP) tear of right shoulder      Past Surgical History: Past Surgical History:  Procedure Laterality Date  . SHOULDER ARTHROSCOPY WITH CAPSULORRHAPHY Right 10/03/2017   Procedure: SHOULDER ATHROSCOPY WITH CAPSULORRHAPHY, REPAIR SLAP LESION AND DEBRIDEMENT;  Surgeon: Salvatore Marvel, MD;  Location: [facility];  Service: Orthopedics;  Laterality: Right;  . TONSILLECTOMY       Family History: No family history on file.  Social History: Social History   Socioeconomic History  . Marital status: Single    Spouse name: Not on file  . Number of children: Not on file  . Years of education: Not on file  . Highest education level: Not on file  Occupational History  . Not on file  Social Needs  . Financial resource strain: Not on file   . Food insecurity:    Worry: Not on file    Inability: Not on file  . Transportation needs:    Medical: Not on file    Non-medical: Not on file  Tobacco Use  . Smoking status: Never Smoker  . Smokeless tobacco: Never Used  Substance and Sexual Activity  . Alcohol use: Yes    Comment: occasionally  . Drug use: Yes    Types: Marijuana    Comment: last used 09/25/2017  . Sexual activity: Not on file  Lifestyle  . Physical activity:    Days per week: Not on file    Minutes per session: Not on file  . Stress: Not on file  Relationships  . Social connections:    Talks on phone: Not on file    Gets together: Not on file    Attends religious service: Not on file    Active member of club or organization: Not on file    Attends meetings of clubs or organizations: Not on file    Relationship status: Not on file  Other Topics Concern  . Not on file  Social History Narrative  . Not on file    Allergies: No Known Allergies  Outpatient Meds: Current Outpatient Medications  Medication Sig Dispense Refill  . acetaminophen (TYLENOL) 325 MG tablet Take 650 mg by mouth every 6 (six) hours as needed.      . cyclobenzaprine (FLEXERIL) 5 MG tablet 1 po q 8 hrs prn muscle spasm 20 tablet 0  . oxyCODONE (OXY IR/ROXICODONE)  5 MG immediate release tablet 1 po q 4 hrs prn pain 30 tablet 0   No current facility-administered medications for this visit.       ___________________________________________________________________ Objective   Exam:  There were no vitals taken for this visit.   General: ***   Eyes: sclera anicteric, no redness  ENT: oral mucosa moist without lesions, no cervical or supraclavicular lymphadenopathy  CV: RRR without murmur, S1/S2, no JVD, no peripheral edema  Resp: clear to auscultation bilaterally, normal RR and effort noted  GI: soft, *** tenderness, with active bowel sounds. No guarding or palpable organomegaly noted.  Skin; warm and dry, no rash or  jaundice noted  Neuro: awake, alert and oriented x 3. Normal gross motor function and fluent speech  Labs:  ***  Radiologic Studies:  ***  Assessment: No diagnosis found.  ***  Plan:  ***  Thank you for the courtesy of this consult.  Please call me with any questions or concerns.  Charlie PitterHenry L Danis III  CC: Referring provider noted above

## 2019-01-13 NOTE — Progress Notes (Signed)
Patient did not join a Lawyer after email invitation.  Patient did not pick up the phone at approximately 4 PM.

## 2019-09-10 ENCOUNTER — Ambulatory Visit (INDEPENDENT_AMBULATORY_CARE_PROVIDER_SITE_OTHER): Payer: Self-pay | Admitting: Otolaryngology

## 2019-10-29 ENCOUNTER — Ambulatory Visit: Payer: Medicaid Other | Admitting: Nurse Practitioner

## 2019-10-29 ENCOUNTER — Other Ambulatory Visit (INDEPENDENT_AMBULATORY_CARE_PROVIDER_SITE_OTHER): Payer: Medicaid Other

## 2019-10-29 ENCOUNTER — Telehealth: Payer: Self-pay | Admitting: Gastroenterology

## 2019-10-29 ENCOUNTER — Encounter: Payer: Self-pay | Admitting: Nurse Practitioner

## 2019-10-29 VITALS — BP 134/102 | HR 88 | Temp 98.4°F | Ht 73.0 in | Wt 217.2 lb

## 2019-10-29 DIAGNOSIS — K219 Gastro-esophageal reflux disease without esophagitis: Secondary | ICD-10-CM

## 2019-10-29 DIAGNOSIS — Z01818 Encounter for other preprocedural examination: Secondary | ICD-10-CM | POA: Diagnosis not present

## 2019-10-29 LAB — COMPREHENSIVE METABOLIC PANEL
ALT: 24 U/L (ref 0–53)
AST: 16 U/L (ref 0–37)
Albumin: 4.9 g/dL (ref 3.5–5.2)
Alkaline Phosphatase: 89 U/L (ref 39–117)
BUN: 17 mg/dL (ref 6–23)
CO2: 27 mEq/L (ref 19–32)
Calcium: 9.6 mg/dL (ref 8.4–10.5)
Chloride: 103 mEq/L (ref 96–112)
Creatinine, Ser: 0.95 mg/dL (ref 0.40–1.50)
GFR: 92.1 mL/min (ref >60.00)
Glucose, Bld: 114 mg/dL — ABNORMAL HIGH (ref 70–99)
Potassium: 3.8 mEq/L (ref 3.5–5.1)
Sodium: 138 mEq/L (ref 135–145)
Total Bilirubin: 0.6 mg/dL (ref 0.2–1.2)
Total Protein: 8 g/dL (ref 6.0–8.3)

## 2019-10-29 LAB — CBC
HCT: 47.2 % (ref 39.0–52.0)
Hemoglobin: 16.4 g/dL (ref 13.0–17.0)
MCHC: 34.7 g/dL (ref 30.0–36.0)
MCV: 88.9 fl (ref 78.0–100.0)
Platelets: 244 10*3/uL (ref 150.0–400.0)
RBC: 5.31 Mil/uL (ref 4.22–5.81)
RDW: 13.8 % (ref 11.5–15.5)
WBC: 8.3 10*3/uL (ref 4.0–10.5)

## 2019-10-29 MED ORDER — OMEPRAZOLE 40 MG PO CPDR: 40.0000 mg | DELAYED_RELEASE_CAPSULE | Freq: Every day | ORAL | 2 refills | Status: DC

## 2019-10-29 MED ORDER — FAMOTIDINE 20 MG PO TABS: 20.0000 mg | ORAL_TABLET | Freq: Every day | ORAL | 3 refills | Status: DC

## 2019-10-29 MED ORDER — OMEPRAZOLE 40 MG PO CPDR: 40.0000 mg | DELAYED_RELEASE_CAPSULE | Freq: Every day | ORAL | 3 refills | Status: DC

## 2019-10-29 MED ORDER — FAMOTIDINE 20 MG PO TABS: 20.0000 mg | ORAL_TABLET | Freq: Every day | ORAL | 0 refills | Status: DC

## 2019-10-29 NOTE — Telephone Encounter (Signed)
[facility] MD: Dr. Russella Dar  After hours call re: new prescriptions  Patient seen in the office today. Prescribed omeprazole 40 mg QAM and famotidine 20 mg QPM. Scripts were not available at [facility] when he went to pick them up today.  Appears they may have been sent to [facility] on [address].   Resending to [facility] [address] [place], [place] [address] now.

## 2019-10-29 NOTE — Progress Notes (Signed)
Reviewed and agree with management plan.  Laykin Rainone T. Mayling Aber, MD F[facility]  

## 2019-10-29 NOTE — Progress Notes (Signed)
10/29/2019 William Villa [id number] 03-07-1986   Chief Complaint:  Acid reflux   HISTORY OF PRESENT ILLNESS:  William Villa is a 34 year old male with a past medical history of anxiety, depression and kidney stones. Past right shoulder surgery and tonsillectomy. He presents today for further evaluation for acid reflux which has progressively worsened over the past 3 months. He underwent a virtual visit by a gastroenterologist at [facility] 12/2018. He was prescribed Pantoprazole 40mg  QD and an EGD was recommended. He was uninsured at that time therefore he did not purse having an EGD. His symptoms initially improved after starting Pantoprazole but by Nov or Dec. 2020 he assessed the Pantoprazole was less effective.  His is in the process of applying for Medicaid. He stated his reflux symptoms are severe and he wishes to proceed with an EGD at this time. He reports having burning acid reflux daily, worse at night. He awakens with white bubbly secretions and foam most mornings. He sometimes coughs up yellow bile secretions. No hematemesis or hemoptysis. He sometimes feels choked from the acid in his throat and his voice is hoarse. He feels as if he has to work harder to swallow, food does not get stuck and no pain to the esophagus when swallowing. No upper or lower abdominal pain. No NSAID use. He drinks a 6 pack of beer once weekly.  He is passing a normal formed brown bowel movement once daily. No rectal bleeding or melena. Occasionally has loose stools. He has a history of anxiety, he took anti-anxiety medications in the past but off anti-anxiety medications for the past 4 years. He is currently unemployed, staying home with his infant daughter with increasing anxiety. He went to the urgent care clinic last week to obtain antianxiety treatment but medications were not prescribed. He does not have a PCP, he intends to select a PCP once his Medicaid has been activated. His BP is elevated today 134/102. I  encouraged him to obtain a PCP asap for further anxiety and HTN management. He denies having any headache, dizziness or chest pain. He smokes marijuana, 3 to 4 times daily for the past 10 years then recently weaning off. No other drug use. Paternal uncle with history of colon cancer diagnosed in his 34 or 14's. No other complaints today.   Note, patient previously schedule an appointment with Dr. Loletha Carrow on 01/13/2019 but was a now show. Patient was seen by [facility] 12/2018.    Past Medical History:  Diagnosis Date  . 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 History of kidney stones   . Partial tear of right rotator cuff   . 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 Type 1 superior labral anterior-to-posterior (SLAP) tear of right shoulder    Past Surgical History:  Procedure Laterality Date  . SHOULDER ARTHROSCOPY WITH CAPSULORRHAPHY Right 10/03/2017   Procedure: SHOULDER ATHROSCOPY WITH CAPSULORRHAPHY, REPAIR SLAP LESION AND DEBRIDEMENT;  Surgeon: Elsie Saas, MD;  Location: [place];  Service: Orthopedics;  Laterality: Right;  . TONSILLECTOMY      reports that he has never smoked. He has never used smokeless tobacco. He reports current alcohol use. He reports current drug use. Drug: Marijuana. family history is not on file. No Known Allergies  Current Outpatient Medications on File Prior to Visit  Medication Sig Dispense Refill  . pantoprazole (PROTONIX) 20 MG tablet Take 20 mg by mouth every morning. Patient stopped taking this past  week.     No current facility-administered medications on file prior to visit.     REVIEW OF SYSTEMS  : All other systems reviewed and negative except where noted in the History of Present Illness.  PHYSICAL EXAM: BP (!) 134/102   Pulse 88   Temp 98.4 F (36.9 C)   Ht 6\' 1"  (1.854 m)   Wt 217 lb 4 oz (98.5 kg)   BMI 28.66 kg/m  General: Well developed 34 year old male  in no acute distress. Head:  Normocephalic and atraumatic. Eyes:  Sclerae non-icteric, conjunctive pink. Ears: Normal auditory acuity. Mouth: Dentition intact. No ulcers or lesions.  Neck: Supple, no lymphadenopathy or thyromegaly.  Lungs: Clear bilaterally to auscultation without wheezes, crackles or rhonchi. Heart: Regular rate and rhythm. No murmur, rub or gallop appreciated.  Abdomen: Soft, nontender, non distended. No masses. No hepatosplenomegaly. Normoactive bowel sounds x 4 quadrants.  Rectal: Deferred.  Musculoskeletal: Symmetrical with no gross deformities. Skin: Warm and dry. No rash or lesions on visible extremities. Extremities: No edema. Neurological: Alert oriented x 4, no focal deficits.  Psychological:  Alert and cooperative. Normal mood and affect.  ASSESSMENT AND PLAN:  61. 34 year old male with GERD with water brash sx, voice changes and questionable dysphagia  -EGD benefits and risks dicussed including risk with sedation, risk of bleeding, infection and perforation  -Stop Pantoprazole  -Start Omeprazole 40mg  1 po QAM and Famotidine 20mg  1 po QHS -CBC, CMP if affordable (patient will check cost with the lab prior to completing lab draw) -GERD handout -Further follow up to be determined after EGD completed. Patient to call our office if his symptoms worsen.  2. Anxiety -Patient to obtain PCP for management   3. HTN   Patient to obtain PCP for management  CC:  No ref. provider found

## 2019-10-29 NOTE — Patient Instructions (Addendum)
If you are age 32 or older, your body mass index should be between 23-30. Your Body mass index is 28.66 kg/m. If this is out of the aforementioned range listed, please consider follow up with your Primary Care Provider.  If you are age 31 or younger, your body mass index should be between 19-25. Your Body mass index is 28.66 kg/m. If this is out of the aformentioned range listed, please consider follow up with your Primary Care Provider.   You have been scheduled for an endoscopy. Please follow written instructions given to you at your visit today. If you use inhalers (even only as needed), please bring them with you on the day of your procedure. Your physician has requested that you go to www.startemmi.com and enter the access code given to you at your visit today. This web site gives a general overview about your procedure. However, you should still follow specific instructions given to you by our office regarding your preparation for the procedure.  We have sent the following medications to your pharmacy for you to pick up at your convenience:  Omeprazole 40 mg Famotidine 20mg   Food Choices for Gastroesophageal Reflux Disease, Adult When you have gastroesophageal reflux disease (GERD), the foods you eat and your eating habits are very important. Choosing the right foods can help ease your discomfort. Think about working with a nutrition specialist (dietitian) to help you make good choices. What are tips for following this plan?  Meals  Choose healthy foods that are low in fat, such as fruits, vegetables, whole grains, low-fat dairy products, and lean meat, fish, and poultry.  Eat small meals often instead of 3 large meals a day. Eat your meals slowly, and in a place where you are relaxed. Avoid bending over or lying down until 2-3 hours after eating.  Avoid eating meals 2-3 hours before bed.  Avoid drinking a lot of liquid with meals.  Cook foods using methods other than frying. Bake,  grill, or broil food instead.  Avoid or limit: ? Chocolate. ? Peppermint or spearmint. ? Alcohol. ? Pepper. ? Black and decaffeinated coffee. ? Black and decaffeinated tea. ? Bubbly (carbonated) soft drinks. ? Caffeinated energy drinks and soft drinks.  Limit high-fat foods such as: ? Fatty meat or fried foods. ? Whole milk, cream, butter, or ice cream. ? Nuts and nut butters. ? Pastries, donuts, and sweets made with butter or shortening.  Avoid foods that cause symptoms. These foods may be different for everyone. Common foods that cause symptoms include: ? Tomatoes. ? Oranges, lemons, and limes. ? Peppers. ? Spicy food. ? Onions and garlic. ? Vinegar. Lifestyle  Maintain a healthy weight. Ask your doctor what weight is healthy for you. If you need to lose weight, work with your doctor to do so safely.  Exercise for at least 30 minutes for 5 or more days each week, or as told by your doctor.  Wear loose-fitting clothes.  Do not smoke. If you need help quitting, ask your doctor.  Sleep with the head of your bed higher than your feet. Use a wedge under the mattress or blocks under the bed frame to raise the head of the bed. Summary  When you have gastroesophageal reflux disease (GERD), food and lifestyle choices are very important in easing your symptoms.  Eat small meals often instead of 3 large meals a day. Eat your meals slowly, and in a place where you are relaxed.  Limit high-fat foods such as fatty meat or  fried foods.  Avoid bending over or lying down until 2-3 hours after eating.  Avoid peppermint and spearmint, caffeine, alcohol, and chocolate. This information is not intended to replace advice given to you by your health care provider. Make sure you discuss any questions you have with your health care provider. Document Revised: 01/07/2019 Document Reviewed: 10/22/2016 Elsevier Patient Education  2020 Elsevier Inc.  Gastroesophageal Reflux Disease,  Adult Gastroesophageal reflux (GER) happens when acid from the stomach flows up into the tube that connects the mouth and the stomach (esophagus). Normally, food travels down the esophagus and stays in the stomach to be digested. With GER, food and stomach acid sometimes move back up into the esophagus. You may have a disease called gastroesophageal reflux disease (GERD) if the reflux:  Happens often.  Causes frequent or very bad symptoms.  Causes problems such as damage to the esophagus. When this happens, the esophagus becomes sore and swollen (inflamed). Over time, GERD can make small holes (ulcers) in the lining of the esophagus. What are the causes? This condition is caused by a problem with the muscle between the esophagus and the stomach. When this muscle is weak or not normal, it does not close properly to keep food and acid from coming back up from the stomach. The muscle can be weak because of:  Tobacco use.  Pregnancy.  Having a certain type of hernia (hiatal hernia).  Alcohol use.  Certain foods and drinks, such as coffee, chocolate, onions, and peppermint. What increases the risk? You are more likely to develop this condition if you:  Are overweight.  Have a disease that affects your connective tissue.  Use NSAID medicines. What are the signs or symptoms? Symptoms of this condition include:  Heartburn.  Difficult or painful swallowing.  The feeling of having a lump in the throat.  A bitter taste in the mouth.  Bad breath.  Having a lot of saliva.  Having an upset or bloated stomach.  Belching.  Chest pain. Different conditions can cause chest pain. Make sure you see your doctor if you have chest pain.  Shortness of breath or noisy breathing (wheezing).  Ongoing (chronic) cough or a cough at night.  Wearing away of the surface of teeth (tooth enamel).  Weight loss. How is this treated? Treatment will depend on how bad your symptoms are. Your  doctor may suggest:  Changes to your diet.  Medicine.  Surgery. Follow these instructions at home: Eating and drinking   Follow a diet as told by your doctor. You may need to avoid foods and drinks such as: ? Coffee and tea (with or without caffeine). ? Drinks that contain alcohol. ? Energy drinks and sports drinks. ? Bubbly (carbonated) drinks or sodas. ? Chocolate and cocoa. ? Peppermint and mint flavorings. ? Garlic and onions. ? Horseradish. ? Spicy and acidic foods. These include peppers, chili powder, curry powder, vinegar, hot sauces, and BBQ sauce. ? Citrus fruit juices and citrus fruits, such as oranges, lemons, and limes. ? Tomato-based foods. These include red sauce, chili, salsa, and pizza with red sauce. ? Fried and fatty foods. These include donuts, french fries, potato chips, and high-fat dressings. ? High-fat meats. These include hot dogs, rib eye steak, sausage, ham, and bacon. ? High-fat dairy items, such as whole milk, butter, and cream cheese.  Eat small meals often. Avoid eating large meals.  Avoid drinking large amounts of liquid with your meals.  Avoid eating meals during the 2-3 hours before bedtime.  Avoid lying down right after you eat.  Do not exercise right after you eat. Lifestyle   Do not use any products that contain nicotine or tobacco. These include cigarettes, e-cigarettes, and chewing tobacco. If you need help quitting, ask your doctor.  Try to lower your stress. If you need help doing this, ask your doctor.  If you are overweight, lose an amount of weight that is healthy for you. Ask your doctor about a safe weight loss goal. General instructions  Pay attention to any changes in your symptoms.  Take over-the-counter and prescription medicines only as told by your doctor. Do not take aspirin, ibuprofen, or other NSAIDs unless your doctor says it is okay.  Wear loose clothes. Do not wear anything tight around your waist.  Raise  (elevate) the head of your bed about 6 inches (15 cm).  Avoid bending over if this makes your symptoms worse.  Keep all follow-up visits as told by your doctor. This is important. Contact a doctor if:  You have new symptoms.  You lose weight and you do not know why.  You have trouble swallowing or it hurts to swallow.  You have wheezing or a cough that keeps happening.  Your symptoms do not get better with treatment.  You have a hoarse voice. Get help right away if:  You have pain in your arms, neck, jaw, teeth, or back.  You feel sweaty, dizzy, or light-headed.  You have chest pain or shortness of breath.  You throw up (vomit) and your throw-up looks like blood or coffee grounds.  You pass out (faint).  Your poop (stool) is bloody or black.  You cannot swallow, drink, or eat. Summary  If a person has gastroesophageal reflux disease (GERD), food and stomach acid move back up into the esophagus and cause symptoms or problems such as damage to the esophagus.  Treatment will depend on how bad your symptoms are.  Follow a diet as told by your doctor.  Take all medicines only as told by your doctor. This information is not intended to replace advice given to you by your health care provider. Make sure you discuss any questions you have with your health care provider. Document Revised: 03/25/2018 Document Reviewed: 03/25/2018 Elsevier Patient Education  2020 [place] you for choosing me and [facility].   Carl Best, NP

## 2019-11-01 ENCOUNTER — Ambulatory Visit (INDEPENDENT_AMBULATORY_CARE_PROVIDER_SITE_OTHER): Payer: Self-pay

## 2019-11-01 ENCOUNTER — Other Ambulatory Visit: Payer: Self-pay

## 2019-11-01 DIAGNOSIS — Z1159 Encounter for screening for other viral diseases: Secondary | ICD-10-CM

## 2019-11-02 LAB — SARS CORONAVIRUS 2 (TAT 6-24 HRS): SARS Coronavirus 2: NEGATIVE

## 2019-11-03 ENCOUNTER — Other Ambulatory Visit: Payer: Self-pay

## 2019-11-03 ENCOUNTER — Encounter: Payer: Self-pay | Admitting: Gastroenterology

## 2019-11-03 ENCOUNTER — Ambulatory Visit (AMBULATORY_SURGERY_CENTER): Payer: Medicaid Other | Admitting: Gastroenterology

## 2019-11-03 VITALS — BP 135/87 | HR 84 | Temp 96.8°F | Resp 14 | Ht 73.0 in | Wt 217.0 lb

## 2019-11-03 DIAGNOSIS — K2970 Gastritis, unspecified, without bleeding: Secondary | ICD-10-CM

## 2019-11-03 DIAGNOSIS — K219 Gastro-esophageal reflux disease without esophagitis: Secondary | ICD-10-CM | POA: Diagnosis not present

## 2019-11-03 DIAGNOSIS — K449 Diaphragmatic hernia without obstruction or gangrene: Secondary | ICD-10-CM

## 2019-11-03 DIAGNOSIS — R1310 Dysphagia, unspecified: Secondary | ICD-10-CM | POA: Diagnosis not present

## 2019-11-03 DIAGNOSIS — E669 Obesity, unspecified: Secondary | ICD-10-CM | POA: Diagnosis not present

## 2019-11-03 DIAGNOSIS — K2950 Unspecified chronic gastritis without bleeding: Secondary | ICD-10-CM | POA: Diagnosis not present

## 2019-11-03 DIAGNOSIS — K228 Other specified diseases of esophagus: Secondary | ICD-10-CM | POA: Diagnosis not present

## 2019-11-03 MED ORDER — SODIUM CHLORIDE 0.9 % IV SOLN: 500.0000 mL | Freq: Once | INTRAVENOUS | Status: DC

## 2019-11-03 NOTE — Progress Notes (Signed)
Called to room to assist during endoscopic procedure.  Patient ID and intended procedure confirmed with present staff. Received instructions for my participation in the procedure from the performing physician.  

## 2019-11-03 NOTE — Progress Notes (Signed)
Report to PACU, RN, vss, BBS= Clear.  

## 2019-11-03 NOTE — Progress Notes (Signed)
Temp-LC VS-KA  Patient reports he took one tablet of 0.5 Xanax last night for anxiety about the procedure. This medication is not prescribed to him.

## 2019-11-03 NOTE — Op Note (Addendum)
[facility] Patient Name: William Villa Procedure Date: 11/03/2019 10:39 AM MRN: [id number] Endoscopist: Meryl Dare , MD Age: 34 Referring MD:  Date of Birth: 1986-08-29 Gender: Male Account #: [id number] Procedure:                Upper GI endoscopy Indications:              Dysphagia, Gastroesophageal reflux disease Medicines:                Monitored Anesthesia Care Procedure:                Pre-Anesthesia Assessment:                           - Prior to the procedure, a History and Physical                            was performed, and patient medications and                            allergies were reviewed. The patient's tolerance of                            previous anesthesia was also reviewed. The risks                            and benefits of the procedure and the sedation                            options and risks were discussed with the patient.                            All questions were answered, and informed consent                            was obtained. Prior Anticoagulants: The patient has                            taken no previous anticoagulant or antiplatelet                            agents. ASA Grade Assessment: II - A patient with                            mild systemic disease. After reviewing the risks                            and benefits, the patient was deemed in                            satisfactory condition to undergo the procedure.                           After obtaining informed consent, the endoscope was  passed under direct vision. Throughout the                            procedure, the patient's blood pressure, pulse, and                            oxygen saturations were monitored continuously. The                            Endoscope was introduced through the mouth, and                            advanced to the second part of duodenum. The upper                            GI endoscopy  was accomplished without difficulty.                            The patient tolerated the procedure well. Scope In: Scope Out: Findings:                 The Z-line was variable and was found 40 cm from                            the incisors. Biopsies were taken with a cold                            forceps for histology.                           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                           No endoscopic abnormality was evident in the                            esophagus to explain the patient's complaint of                            dysphagia. It was decided, however, to proceed with                            dilation of the entire esophagus. A guidewire was                            placed and the scope was withdrawn. Dilation was                            performed with a Savary dilator with no resistance  at 17 mm.                           Patchy mild inflammation characterized by erythema                            and granularity was found in the gastric body and                            in the gastric antrum. Biopsies were taken with a                            cold forceps for histology.                           A small hiatal hernia was present.                           The exam of the stomach was otherwise normal.                           The duodenal bulb and second portion of the                            duodenum were normal. Complications:            No immediate complications. Estimated Blood Loss:     Estimated blood loss was minimal. Impression:               - Z-line variable, 40 cm from the incisors.                            Biopsied.                           - Two inlet patches, upper esophagus.                           - No endoscopic esophageal abnormality to explain                            patient's dysphagia. Esophagus dilated.                            - Gastritis. Biopsied.                           - Small hiatal hernia.                           - Normal duodenal bulb and second portion of the                            duodenum. Recommendation:           - Patient has a contact number available for  emergencies. The signs and symptoms of potential                            delayed complications were discussed with the                            patient. Return to normal activities tomorrow.                            Written discharge instructions were provided to the                            patient.                           - Clear liquid diet for 2 hours, then advance as                            tolerated to soft diet today.                           - Resume prior diet tomorrow.                           - Antireflux measures long term.                           - Continue present medications including omeprazole                            40 mg po qam and famotidine 20 mg po hs.                           - Await pathology results.                           - Return to GI office in 6 weeks. Ladene Artist, MD 11/03/2019 11:15:02 AM This report has been signed electronically.

## 2019-11-03 NOTE — Patient Instructions (Signed)
Please read handouts provided. Clear liquid diet for 2 hours, then advance as tolerated to soft diet today. Resume prior diet tomorrow. Antireflux measures long term. Await pathology results. Continue present medications including omeprazole 40 mg every morning and famotidine 20 mg every night. Return to GI office in 6 weeks.       YOU HAD AN ENDOSCOPIC PROCEDURE TODAY AT THE [facility]:   Refer to the procedure report that was given to you for any specific questions about what was found during the examination.  If the procedure report does not answer your questions, please call your gastroenterologist to clarify.  If you requested that your care partner not be given the details of your procedure findings, then the procedure report has been included in a sealed envelope for you to review at your convenience later.  YOU SHOULD EXPECT: Some feelings of bloating in the abdomen. Passage of more gas than usual.  Walking can help get rid of the air that was put into your GI tract during the procedure and reduce the bloating. If you had a lower endoscopy (such as a colonoscopy or flexible sigmoidoscopy) you may notice spotting of blood in your stool or on the toilet paper. If you underwent a bowel prep for your procedure, you may not have a normal bowel movement for a few days.  Please Note:  You might notice some irritation and congestion in your nose or some drainage.  This is from the oxygen used during your procedure.  There is no need for concern and it should clear up in a day or so.  SYMPTOMS TO REPORT IMMEDIATELY:     Following upper endoscopy (EGD)  Vomiting of blood or coffee ground material  New chest pain or pain under the shoulder blades  Painful or persistently difficult swallowing  New shortness of breath  Fever of 100F or higher  Black, tarry-looking stools  For urgent or emergent issues, a gastroenterologist can be reached at any hour by calling [phone]  [phone].   DIET:   Drink plenty of fluids but you should avoid alcoholic beverages for 24 hours.  ACTIVITY:  You should plan to take it easy for the rest of today and you should NOT DRIVE or use heavy machinery until tomorrow (because of the sedation medicines used during the test).    FOLLOW UP: Our staff will call the number listed on your records 48-72 hours following your procedure to check on you and address any questions or concerns that you may have regarding the information given to you following your procedure. If we do not reach you, we will leave a message.  We will attempt to reach you two times.  During this call, we will ask if you have developed any symptoms of COVID 19. If you develop any symptoms (ie: fever, flu-like symptoms, shortness of breath, cough etc.) before then, please call [phone].  If you test positive for Covid 19 in the 2 weeks post procedure, please call and report this information to [place].    If any biopsies were taken you will be contacted by phone or by letter within the next 1-3 weeks.  Please call us at [phone] if you have not heard about the biopsies in 3 weeks.    SIGNATURES/CONFIDENTIALITY: You and/or your care partner have signed paperwork which will be entered into your electronic medical record.  These signatures attest to the fact that that the information above on your After Visit Summary has been reviewed  and is understood.  Full responsibility of the confidentiality of this discharge information lies with you and/or your care-partner. 

## 2019-11-05 ENCOUNTER — Telehealth: Payer: Self-pay

## 2019-11-05 ENCOUNTER — Encounter: Payer: Self-pay | Admitting: Gastroenterology

## 2019-11-05 NOTE — Telephone Encounter (Signed)
  Follow up Call-  Call back number 11/03/2019  Post procedure Call Back phone  # [phone]  Permission to leave phone message Yes  Some recent data might be hidden     Patient questions:  Do you have a fever, pain , or abdominal swelling? No. Pain Score  0 *  Have you tolerated food without any problems? Yes.    Have you been able to return to your normal activities? Yes.    Do you have any questions about your discharge instructions: Diet   No. Medications  No. Follow up visit  No.  Do you have questions or concerns about your Care? No.  Actions: * If pain score is 4 or above: No action needed, pain <4. 1. Have you developed a fever since your procedure? no  2.   Have you had an respiratory symptoms (SOB or cough) since your procedure? no  3.   Have you tested positive for COVID 19 since your procedure no  4.   Have you had any family members/close contacts diagnosed with the COVID 19 since your procedure?  no   If yes to any of these questions please route to Laverna Peace, RN and Jennye Boroughs, Charity fundraiser.

## 2020-01-15 DIAGNOSIS — M25511 Pain in right shoulder: Secondary | ICD-10-CM | POA: Diagnosis not present

## 2020-01-26 DIAGNOSIS — M25511 Pain in right shoulder: Secondary | ICD-10-CM | POA: Diagnosis not present

## 2020-02-16 DIAGNOSIS — M25511 Pain in right shoulder: Secondary | ICD-10-CM | POA: Diagnosis not present

## 2020-08-18 ENCOUNTER — Emergency Department (HOSPITAL_BASED_OUTPATIENT_CLINIC_OR_DEPARTMENT_OTHER): Payer: Medicaid Other

## 2020-08-18 ENCOUNTER — Encounter (HOSPITAL_BASED_OUTPATIENT_CLINIC_OR_DEPARTMENT_OTHER): Payer: Self-pay

## 2020-08-18 ENCOUNTER — Other Ambulatory Visit: Payer: Self-pay

## 2020-08-18 ENCOUNTER — Emergency Department (HOSPITAL_BASED_OUTPATIENT_CLINIC_OR_DEPARTMENT_OTHER)
Admission: EM | Admit: 2020-08-18 | Discharge: 2020-08-18 | Disposition: A | Discharge status: Home / Self Care | Payer: Medicaid Other | Attending: Emergency Medicine | Admitting: Emergency Medicine

## 2020-08-18 DIAGNOSIS — I10 Essential (primary) hypertension: Secondary | ICD-10-CM | POA: Diagnosis not present

## 2020-08-18 DIAGNOSIS — R079 Chest pain, unspecified: Secondary | ICD-10-CM | POA: Diagnosis not present

## 2020-08-18 DIAGNOSIS — R002 Palpitations: Secondary | ICD-10-CM | POA: Diagnosis not present

## 2020-08-18 DIAGNOSIS — R0789 Other chest pain: Secondary | ICD-10-CM

## 2020-08-18 LAB — BASIC METABOLIC PANEL
Anion gap: 10 (ref 5–15)
BUN: 10 mg/dL (ref 6–20)
CO2: 23 mmol/L (ref 22–32)
Calcium: 9.2 mg/dL (ref 8.9–10.3)
Chloride: 104 mmol/L (ref 98–111)
Creatinine, Ser: 0.87 mg/dL (ref 0.61–1.24)
GFR, Estimated: >60 mL/min (ref >60)
Glucose, Bld: 119 mg/dL — ABNORMAL HIGH (ref 70–99)
Potassium: 4 mmol/L (ref 3.5–5.1)
Sodium: 137 mmol/L (ref 135–145)

## 2020-08-18 LAB — TROPONIN I (HIGH SENSITIVITY)
Troponin I (High Sensitivity): <2 ng/L (ref <18)
Troponin I (High Sensitivity): <2 ng/L (ref <18)

## 2020-08-18 LAB — CBC
HCT: 45.1 % (ref 39.0–52.0)
Hemoglobin: 15.5 g/dL (ref 13.0–17.0)
MCH: 29.9 pg (ref 26.0–34.0)
MCHC: 34.4 g/dL (ref 30.0–36.0)
MCV: 87.1 fL (ref 80.0–100.0)
Platelets: 224 10*3/uL (ref 150–400)
RBC: 5.18 MIL/uL (ref 4.22–5.81)
RDW: 13.1 % (ref 11.5–15.5)
WBC: 7.6 10*3/uL (ref 4.0–10.5)
nRBC: 0 % (ref 0.0–0.2)

## 2020-08-18 NOTE — ED Triage Notes (Signed)
Pt states he has had left sided chest pain x 2 weeks, worse last night. States worse with exertion. Hx hypertension.

## 2020-08-18 NOTE — ED Notes (Signed)
AVS reviewed with pt, copy of AVS provided to client as well

## 2020-08-18 NOTE — ED Provider Notes (Signed)
[facility] EMERGENCY DEPARTMENT Provider Note   CSN: [id number] Arrival date & time: 08/18/20  1043     History Chief Complaint  Patient presents with  . Chest Pain    William Villa is a 34 y.o. male.  Patient is a 34 year old male with a history of hypertension, kidney stones, GERD, marijuana use who presents with chest pain.  He said he has had some intermittent left-sided chest pain for about the last 2 weeks.  It radiates to his left side but no abdominal pain.  No neck or back pain.  He says that its not related to exertion.  On the triage note, it is reported to be worse with exertion although patient says he actually notices it more when he is just sitting down.  He does say that he has also had some intermittent palpitations and the feeling that heart is racing.  This does not really seem to be related to the current chest pain.  He has had these episodes for about the last several months.  It happens when he is playing certain videogames and he had an episode a few days ago while he was having intercourse where he felt like his heart was racing and he got lightheaded and he had some associated chest pain across his chest at that time.  He does not have any current palpitations.  No associated shortness of breath, nausea, vomiting or diaphoresis.  No known history of prior cardiac events.  No leg pain or swelling.  No cough or cold symptoms.        Past Medical History:  Diagnosis Date  . Anxiety   . 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 GERD (gastroesophageal reflux disease)   . History of kidney stones   . Hypertension   . Partial tear of right rotator cuff   . 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 Substance abuse (HCC)    smokes marajuana  . Type 1 superior labral anterior-to-posterior (SLAP) tear of right shoulder     Patient Active Problem List   Diagnosis Date Noted  . GERD (gastroesophageal reflux disease) 10/29/2019   . Partial tear of right rotator cuff   . Type 1 superior labral anterior-to-posterior (SLAP) tear of right shoulder   . Recurrent dislocation of right shoulder 08/30/2017  . Articular cartilage disorder of shoulder, right 08/30/2017    Past Surgical History:  Procedure Laterality Date  . SHOULDER ARTHROSCOPY WITH CAPSULORRHAPHY Right 10/03/2017   Procedure: SHOULDER ATHROSCOPY WITH CAPSULORRHAPHY, REPAIR SLAP LESION AND DEBRIDEMENT;  Surgeon: Salvatore Marvel, MD;  Location: [facility];  Service: Orthopedics;  Laterality: Right;  . TONSILLECTOMY         Family History  Problem Relation Age of Onset  . GER disease Mother   . Anxiety disorder Mother   . Depression Mother   . Healthy Father   . Colon cancer Maternal Uncle   . Esophageal cancer Neg Hx   . Rectal cancer Neg Hx   . Stomach cancer Neg Hx     Social History   Tobacco Use  . Smoking status: Never Smoker  . Smokeless tobacco: Never Used  Vaping Use  . Vaping Use: Never used  Substance Use Topics  . Alcohol use: Yes    Comment: occasionally  . Drug use: Not Currently    Types: Marijuana    Comment: last used 10/30/2019    Home Medications Prior to Admission medications   Medication Sig  Start Date End Date Taking? Authorizing Provider  omeprazole (PRILOSEC) 40 MG capsule Take 1 capsule (40 mg total) by mouth daily. 10/29/19  Yes Tressia Danas, MD  famotidine (PEPCID) 20 MG tablet Take 1 tablet (20 mg total) by mouth at bedtime. Patient not taking: Reported on 11/03/2019 10/29/19   Tressia Danas, MD    Allergies    Patient has no known allergies.  Review of Systems   Review of Systems  Constitutional: Negative for chills, diaphoresis, fatigue and fever.  HENT: Negative for congestion, rhinorrhea and sneezing.   Eyes: Negative.   Respiratory: Negative for cough, chest tightness and shortness of breath.   Cardiovascular: Positive for chest pain and palpitations. Negative for leg swelling.    Gastrointestinal: Negative for abdominal pain, blood in stool, diarrhea, nausea and vomiting.  Genitourinary: Negative for difficulty urinating, flank pain, frequency and hematuria.  Musculoskeletal: Negative for arthralgias and back pain.  Skin: Negative for rash.  Neurological: Negative for dizziness, speech difficulty, weakness, numbness and headaches.    Physical Exam Updated Vital Signs BP 139/85 (BP Location: Left Arm)   Pulse 84   Temp 98.2 F (36.8 C) (Oral)   Resp 18   Ht 6\' 1"  (1.854 m)   Wt 104.3 kg   SpO2 100%   BMI 30.34 kg/m   Physical Exam Constitutional:      Appearance: He is well-developed.  HENT:     Head: Normocephalic and atraumatic.  Eyes:     Pupils: Pupils are equal, round, and reactive to light.  Cardiovascular:     Rate and Rhythm: Normal rate and regular rhythm.     Heart sounds: Normal heart sounds.  Pulmonary:     Effort: Pulmonary effort is normal. No respiratory distress.     Breath sounds: Normal breath sounds. No wheezing or rales.  Chest:     Chest wall: No tenderness.  Abdominal:     General: Bowel sounds are normal.     Palpations: Abdomen is soft.     Tenderness: There is no abdominal tenderness. There is no guarding or rebound.  Musculoskeletal:        General: Normal range of motion.     Cervical back: Normal range of motion and neck supple.     Comments: No edema or calf tenderness  Lymphadenopathy:     Cervical: No cervical adenopathy.  Skin:    General: Skin is warm and dry.     Findings: No rash.  Neurological:     Mental Status: He is alert and oriented to person, place, and time.     ED Results / Procedures / Treatments   Labs (all labs ordered are listed, but only abnormal results are displayed) Labs Reviewed  BASIC METABOLIC PANEL - Abnormal; Notable for the following components:      Result Value   Glucose, Bld 119 (*)    All other components within normal limits  CBC  TROPONIN I (HIGH SENSITIVITY)   TROPONIN I (HIGH SENSITIVITY)    EKG EKG Interpretation  Date/Time:  Friday August 18 2020 10:51:14 EST Ventricular Rate:  94 PR Interval:    QRS Duration: 93 QT Interval:  341 QTC Calculation: 427 R Axis:   81 Text Interpretation: Sinus rhythm since last tracing no significant change Confirmed by [id number]) on 08/18/2020 10:55:53 AM   Radiology DG Chest 2 View  Result Date: 08/18/2020 CLINICAL DATA:  Chest pain. EXAM: CHEST - 2 VIEW COMPARISON:  September 10, 2004. FINDINGS: The heart  size and mediastinal contours are within normal limits. Both lungs are clear. No visible pleural effusions or pneumothorax. The visualized skeletal structures are unremarkable. IMPRESSION: No acute cardiopulmonary disease. Electronically Signed   By: Feliberto Harts MD   On: 08/18/2020 11:40    Procedures Procedures (including critical care time)  Medications Ordered in ED Medications - No data to display  ED Course  I have reviewed the triage vital signs and the nursing notes.  Pertinent labs & imaging results that were available during my care of the patient were reviewed by me and considered in my medical decision making (see chart for details).    MDM Rules/Calculators/A&P                          Patient is a 34 year old male who presents with chest pain.  It is left-sided that he has no associated symptoms.  No exertional symptoms.  He does have some incidence recently of palpitations.  His EKG does not show any ischemic changes.  He has had 2 - troponins.  His labs are nonconcerning.  His chest x-ray is clear without evidence of pneumonia or pneumothorax.  He does not have other symptoms that would be more concerning for PE or dissection.  He is currently pain-free.  He was discharged home in good condition.  He was given a referral to follow-up with cardiology.  I discussed with him that he may need a Holter monitor.  Return precautions were given. Final Clinical  Impression(s) / ED Diagnoses Final diagnoses:  Atypical chest pain  Palpitations    Rx / DC Orders ED Discharge Orders    None       Rolan Bucco, MD 08/18/20 1454

## 2020-10-22 ENCOUNTER — Other Ambulatory Visit: Payer: Self-pay | Admitting: Gastroenterology

## 2020-10-31 ENCOUNTER — Ambulatory Visit: Payer: Medicaid Other | Admitting: Physician Assistant

## 2020-12-21 ENCOUNTER — Other Ambulatory Visit: Payer: Self-pay | Admitting: Gastroenterology

## 2021-01-08 ENCOUNTER — Other Ambulatory Visit: Payer: Self-pay

## 2021-01-08 ENCOUNTER — Ambulatory Visit: Payer: Medicaid Other | Admitting: Medical

## 2021-01-08 VITALS — BP 130/80 | HR 84 | Resp 16 | Ht 73.0 in | Wt 216.6 lb

## 2021-01-08 DIAGNOSIS — F32A Depression, unspecified: Secondary | ICD-10-CM | POA: Diagnosis not present

## 2021-01-08 DIAGNOSIS — F419 Anxiety disorder, unspecified: Secondary | ICD-10-CM | POA: Diagnosis not present

## 2021-01-08 DIAGNOSIS — K219 Gastro-esophageal reflux disease without esophagitis: Secondary | ICD-10-CM | POA: Diagnosis not present

## 2021-01-08 DIAGNOSIS — H9202 Otalgia, left ear: Secondary | ICD-10-CM

## 2021-01-08 DIAGNOSIS — G4700 Insomnia, unspecified: Secondary | ICD-10-CM

## 2021-01-08 DIAGNOSIS — L989 Disorder of the skin and subcutaneous tissue, unspecified: Secondary | ICD-10-CM

## 2021-01-08 DIAGNOSIS — H6092 Unspecified otitis externa, left ear: Secondary | ICD-10-CM

## 2021-01-08 DIAGNOSIS — K047 Periapical abscess without sinus: Secondary | ICD-10-CM

## 2021-01-08 MED ORDER — HYDROXYZINE PAMOATE 25 MG PO CAPS: ORAL_CAPSULE | ORAL | 0 refills | Status: DC

## 2021-01-08 MED ORDER — NEOMYCIN-POLYMYXIN-HC 3.5-10000-1 OT SOLN: 3.0000 [drp] | Freq: Four times a day (QID) | OTIC | 0 refills | Status: DC

## 2021-01-08 MED ORDER — SERTRALINE HCL 50 MG PO TABS: 50.0000 mg | ORAL_TABLET | Freq: Every day | ORAL | 3 refills | Status: AC

## 2021-01-08 MED ORDER — AMOXICILLIN-POT CLAVULANATE 875-125 MG PO TABS: 1.0000 | ORAL_TABLET | Freq: Two times a day (BID) | ORAL | 0 refills | Status: DC

## 2021-01-08 NOTE — Progress Notes (Signed)
Subjective:    Patient ID: William Villa, male    DOB: 12-12-1985, 35 y.o.   MRN: [id number]  HPI  Pt in for first time. 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 Pt does not exercise regularly. Most he does is yard work once a week. Pt states tried to eat healthy. Smokes marijuana 2-3 times a day. No alcohl use.   Pt has some gerd history. Hx of egd in the past. Pt used to be on ppi. Pt thought omeprazole was bothering. He is now just on famotadine. He states feels much better.  Pt states he has been on and off sertraline. He states since January he restarted sertraline in January. He had been on sertraline in past. He borrowed some from mom. He states his mood better since starting sertrlaine. He has depression and anxiety. Worse anxiety. Currently on sertraline 50 mg daily. He states happy with current dose. Years ago was on 100 mg daily.  phq-9 score 8. Gad-7 score 10.  Hx of insomnia. Pt not sure what he tried. But states has tried various meds in past but none really worked.   Left ear pain on and off achy for months. Left back molor that was told to have root canal about one year ago.(he got root canal but never got crown) Left submandibular node occasionaly tender.  He uses q- tip at time and has ear pain.   Review of Systems  Constitutional: Negative for chills and fatigue.  Respiratory: Negative for cough, chest tightness, shortness of breath and wheezing.   Cardiovascular: Negative for chest pain and palpitations.  Gastrointestinal: Negative for abdominal pain and constipation.  Genitourinary: Negative for dysuria, frequency, penile pain, testicular pain and urgency.  Musculoskeletal: Negative for back pain, joint swelling, myalgias, neck pain and neck stiffness.  Neurological: Negative for dizziness and headaches.  Hematological: Negative for adenopathy. Does not bruise/bleed easily.  Psychiatric/Behavioral: Positive for dysphoric mood and sleep  disturbance. Negative for agitation, confusion and suicidal ideas. The patient is nervous/anxious.     Past Medical History:  Diagnosis Date  . Anxiety   . 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 GERD (gastroesophageal reflux disease)   . History of kidney stones   . Hypertension   . Partial tear of right rotator cuff   . 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 Substance abuse (HCC)    smokes marajuana  . Type 1 superior labral anterior-to-posterior (SLAP) tear of right shoulder      Social History   Socioeconomic History  . Marital status: Single    Spouse name: Not on file  . Number of children: 2  . Years of education: Not on file  . Highest education level: Not on file  Occupational History  . 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 Smoking status: Never Smoker  . Smokeless tobacco: Never Used  Vaping Use  . Vaping Use: Never used  Substance and Sexual Activity  . Alcohol use: Yes    Comment: occasionally  . Drug use: Not Currently    Types: Marijuana    Comment: last used 10/30/2019  . Sexual activity: Not on file  Other Topics Concern  . Not on file  Social History Narrative  . Not on file   Social Determinants of Health   Financial Resource Strain: Not on file  Food Insecurity: Not on file  Transportation Needs: Not on file  Physical Activity: Not on file  Stress: Not on file  Social Connections: Not on file  Intimate Partner Violence: Not on file    Past Surgical History:  Procedure Laterality Date  . SHOULDER ARTHROSCOPY WITH CAPSULORRHAPHY Right 10/03/2017   Procedure: SHOULDER ATHROSCOPY WITH CAPSULORRHAPHY, REPAIR SLAP LESION AND DEBRIDEMENT;  Surgeon: Salvatore Marvel, MD;  [facility];  Service: Orthopedics;  Laterality: Right;  . TONSILLECTOMY      Family History  Problem Relation Age of Onset  . GER disease Mother   . Anxiety disorder Mother   . Depression Mother   . Healthy Father    . Colon cancer Maternal Uncle   . Esophageal cancer Neg Hx   . Rectal cancer Neg Hx   . Stomach cancer Neg Hx     No Known Allergies  Current Outpatient Medications on File Prior to Visit  Medication Sig Dispense Refill  . sertraline (ZOLOFT) 25 MG tablet Take 25 mg by mouth daily.    Marland Kitchen omeprazole (PRILOSEC) 40 MG capsule TAKE 1 CAPSULE(40 MG) BY MOUTH DAILY 90 capsule 3   No current facility-administered medications on file prior to visit.    BP 130/80   Pulse 84   Resp 16   Ht 6\' 1"  (1.854 m)   Wt 216 lb 9.6 oz (98.2 kg)   SpO2 98%   BMI 28.58 kg/m       Objective:   Physical Exam  General Mental Status- Alert. General Appearance- Not in acute distress.   Skin Scalp. Behind both ears in hairline skin lesions that appear wart like.  Neck Carotid Arteries- Normal color. Moisture- Normal Moisture. No carotid bruits. No JVD.  Chest and Lung Exam Auscultation: Breath Sounds:-Normal.  Cardiovascular Auscultation:Rythm- Regular. Murmurs & Other Heart Sounds:Auscultation of the heart reveals- No Murmurs.  Abdomen Inspection:-Inspeection Normal. Palpation/Percussion:Note:No mass. Palpation and Percussion of the abdomen reveal- Non Tender, Non Distended + BS, no rebound or guarding.   Neurologic Cranial Nerve exam:- CN III-XII intact(No nystagmus), symmetric smile. Strength:- 5/5 equal and symmetric strength both upper and lower extremities.  heent- left canal- can does look red and swollen.  Tm looks normal. Mouth- back left molar has temporary filling.  Left submandibular nodes appears faint swollen.       Assessment & Plan:  Hx of gerd. Presently controlled with diet and famotadine.  For depression and anxiety can continue with sertraline. Will give your own prescription sertraline 50 mg daily.  For insomnia follow conservative measures. Hard to choose med for you if you can't remember specific one that failed. We discussed hydroxyzine occasional use  and will try. Rx advisement given.  For left ear canal pain and left submandibular node region pain will prescribe augmentin and cortisporin otic drops. Your canal does look mild inflamed. Also question if tooth infection possible based on your history.    Follow up in 2 weeks or as needed.

## 2021-01-08 NOTE — Patient Instructions (Addendum)
Hx of gerd. Presently controlled with diet and famotadine.  For depression and anxiety can continue with sertraline. Will give your own prescription sertraline 50 mg daily.  For insomnia follow conservative measures. Hard to choose med for you if you can't remember specific one that failed. We discussed hydroxyzine occasional use and will try. Rx advisement given.  For left ear canal pain and left submandibular node region pain will prescribe augmentin and cortisporin otic drops. Your canal does look mild inflamed. Also question if tooth infection possible based on your history.   Refer to derm to evaluate skin lesion on scalp.   Follow up in 2 weeks or as needed.

## 2021-01-22 ENCOUNTER — Ambulatory Visit: Payer: Medicaid Other | Admitting: Medical

## 2021-01-23 ENCOUNTER — Other Ambulatory Visit: Payer: Self-pay | Admitting: Gastroenterology

## 2021-02-09 ENCOUNTER — Ambulatory Visit: Payer: Medicaid Other | Admitting: Medical

## 2021-03-05 ENCOUNTER — Telehealth: Payer: Self-pay | Admitting: *Deleted

## 2021-03-05 ENCOUNTER — Encounter: Payer: Medicaid Other | Admitting: Medical

## 2021-03-05 NOTE — Telephone Encounter (Signed)
Appointment cancelled.    Left message on machine for patient to call back to reschedule. 

## 2021-03-05 NOTE — Telephone Encounter (Signed)
Caller Name Crista Curb [facility] Caller Phone Number [phone] Patient Name William Villa Patient DOB [birth year] Call Type Message Only Information Provided Reason for Call Request to Reschedule Office Appointment Initial Comment Caller states, needs to reschedule today's appt. at 1030. Has no babysitter. Patient request to speak to RN No Disp. Time Disposition Final User 03/05/2021 7:14:25 AM General Information Provided Yes Merrily Pew

## 2021-10-22 DIAGNOSIS — M545 Low back pain, unspecified: Secondary | ICD-10-CM | POA: Diagnosis not present

## 2021-10-22 DIAGNOSIS — M546 Pain in thoracic spine: Secondary | ICD-10-CM | POA: Diagnosis not present

## 2021-10-30 DIAGNOSIS — Z20822 Contact with and (suspected) exposure to covid-19: Secondary | ICD-10-CM | POA: Diagnosis not present

## 2021-10-30 DIAGNOSIS — J029 Acute pharyngitis, unspecified: Secondary | ICD-10-CM | POA: Diagnosis not present

## 2021-10-30 DIAGNOSIS — R051 Acute cough: Secondary | ICD-10-CM | POA: Diagnosis not present

## 2021-12-17 DIAGNOSIS — H1033 Unspecified acute conjunctivitis, bilateral: Secondary | ICD-10-CM | POA: Diagnosis not present

## 2021-12-21 ENCOUNTER — Other Ambulatory Visit: Payer: Self-pay

## 2021-12-21 ENCOUNTER — Ambulatory Visit
Admission: RE | Admit: 2021-12-21 | Discharge: 2021-12-21 | Disposition: A | Discharge status: Home / Self Care | Payer: Medicaid Other | Source: Ambulatory Visit | Attending: Emergency Medicine | Admitting: Emergency Medicine

## 2021-12-21 VITALS — BP 148/86 | HR 97 | Temp 98.2°F | Resp 18

## 2021-12-21 DIAGNOSIS — J0100 Acute maxillary sinusitis, unspecified: Secondary | ICD-10-CM

## 2021-12-21 DIAGNOSIS — H1033 Unspecified acute conjunctivitis, bilateral: Secondary | ICD-10-CM

## 2021-12-21 LAB — POCT RAPID STREP A (OFFICE): Rapid Strep A Screen: NEGATIVE

## 2021-12-21 MED ORDER — AMOXICILLIN-POT CLAVULANATE 875-125 MG PO TABS: 1.0000 | ORAL_TABLET | Freq: Two times a day (BID) | ORAL | 0 refills | Status: DC

## 2021-12-21 NOTE — Discharge Instructions (Addendum)
Take the antibiotic as directed.  Follow up with your primary care provider if your symptoms are not improving.     

## 2021-12-21 NOTE — ED Triage Notes (Signed)
Pt c/o cough, stuffy nose, and ST x 2 days  ?

## 2021-12-21 NOTE — ED Provider Notes (Signed)
?[facility] ? ? ? ?CSN: [id number] ?Arrival date & time: 12/21/21  1138 ? ? ?  ? ?History   ?Chief Complaint ?Chief Complaint  ?Patient presents with  ? Sore Throat  ?  Sick for the last 2 weeks; cough, congestion, history of pink eye within the last week. - Entered by patient  ? ? ?HPI ?William Villa is a 36 y.o. male.  Patient presents with 1 week history of bilateral eye redness, drainage, crusting in lashes.  He also has runny nose, congestion, postnasal drip, sore throat, cough.  He was seen at another urgent care 6 days ago and started on antibiotic eyedrops; symptoms have not improved.  He has tried OTC sinus medication without relief.  His medical history includes hypertension, GERD, kidney stones. ? ?The history is provided by the patient and medical records.  ? ?Past Medical History:  ?Diagnosis Date  ? Anxiety   ? 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 GERD (gastroesophageal reflux disease)   ? History of kidney stones   ? Hypertension   ? Partial tear of right rotator cuff   ? 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 Substance abuse (HCC)   ? smokes marajuana  ? Type 1 superior labral anterior-to-posterior (SLAP) tear of right shoulder   ? ? ?Patient Active Problem List  ? Diagnosis Date Noted  ? GERD (gastroesophageal reflux disease) 10/29/2019  ? Partial tear of right rotator cuff   ? Type 1 superior labral anterior-to-posterior (SLAP) tear of right shoulder   ? Recurrent dislocation of right shoulder 08/30/2017  ? Articular cartilage disorder of shoulder, right 08/30/2017  ? ? ?Past Surgical History:  ?Procedure Laterality Date  ? SHOULDER ARTHROSCOPY WITH CAPSULORRHAPHY Right 10/03/2017  ? Procedure: SHOULDER ATHROSCOPY WITH CAPSULORRHAPHY, REPAIR SLAP LESION AND DEBRIDEMENT;  Surgeon: Salvatore Marvel, MD;  Location: [facility];  Service: Orthopedics;  Laterality: Right;  ? TONSILLECTOMY    ? ? ? ? ? ?Home Medications   ? ?Prior to Admission  medications   ?Medication Sig Start Date End Date Taking? Authorizing Provider  ?amoxicillin-clavulanate (AUGMENTIN) 875-125 MG tablet Take 1 tablet by mouth every 12 (twelve) hours. 12/21/21  Yes Mickie Bail, NP  ?hydrOXYzine (VISTARIL) 25 MG capsule 1 tab po q hs prn insomnia 01/08/21   Saguier, Ramon Dredge, PA-C  ?neomycin-polymyxin-hydrocortisone (CORTISPORIN) OTIC solution Place 3 drops into the left ear 4 (four) times daily. 01/08/21   Saguier, Ramon Dredge, PA-C  ?omeprazole (PRILOSEC) 40 MG capsule TAKE 1 CAPSULE(40 MG) BY MOUTH DAILY 12/22/20   Tressia Danas, MD  ?sertraline (ZOLOFT) 50 MG tablet Take 1 tablet (50 mg total) by mouth daily. 01/08/21   Saguier, Ramon Dredge, PA-C  ? ? ?Family History ?Family History  ?Problem Relation Age of Onset  ? GER disease Mother   ? Anxiety disorder Mother   ? Depression Mother   ? Healthy Father   ? Colon cancer Maternal Uncle   ? Esophageal cancer Neg Hx   ? Rectal cancer Neg Hx   ? Stomach cancer Neg Hx   ? ? ?Social History ?Social History  ? ?Tobacco Use  ? Smoking status: Never  ? Smokeless tobacco: Never  ?Vaping Use  ? Vaping Use: Never used  ?Substance Use Topics  ? Alcohol use: Yes  ?  Comment: occasionally  ? Drug use: Not Currently  ?  Types: Marijuana  ?  Comment: last used 10/30/2019  ? ? ? ?Allergies   ?Patient has no  known allergies. ? ? ?Review of Systems ?Review of Systems  ?Constitutional:  Negative for chills and fever.  ?HENT:  Positive for congestion, postnasal drip, rhinorrhea and sore throat. Negative for ear pain.   ?Eyes:  Positive for discharge, redness and itching. Negative for pain and visual disturbance.  ?Respiratory:  Positive for cough. Negative for shortness of breath.   ?Cardiovascular:  Negative for chest pain and palpitations.  ?Gastrointestinal:  Negative for diarrhea and vomiting.  ?Skin:  Negative for color change and rash.  ?All other systems reviewed and are negative. ? ? ?Physical Exam ?Triage Vital Signs ?ED Triage Vitals  ?Enc Vitals Group   ?   BP 12/21/21 1207 (!) 148/86  ?   Pulse Rate 12/21/21 1207 97  ?   Resp 12/21/21 1207 18  ?   Temp 12/21/21 1207 98.2 ?F (36.8 ?C)  ?   Temp src --   ?   SpO2 12/21/21 1207 98 %  ?   Weight --   ?   Height --   ?   Head Circumference --   ?   Peak Flow --   ?   Pain Score 12/21/21 1215 5  ?   Pain Loc --   ?   Pain Edu? --   ?   Excl. in GC? --   ? ?No data found. ? ?Updated Vital Signs ?BP (!) 148/86   Pulse 97   Temp 98.2 ?F (36.8 ?C)   Resp 18   SpO2 98%  ? ?Visual Acuity ?Right Eye Distance:   ?Left Eye Distance:   ?Bilateral Distance:   ? ?Right Eye Near:   ?Left Eye Near:    ?Bilateral Near:    ? ?Physical Exam ?Vitals and nursing note reviewed.  ?Constitutional:   ?   General: He is not in acute distress. ?   Appearance: He is well-developed. He is not ill-appearing.  ?HENT:  ?   Right Ear: Tympanic membrane normal.  ?   Left Ear: Tympanic membrane normal.  ?   Nose: Congestion and rhinorrhea present.  ?   Mouth/Throat:  ?   Mouth: Mucous membranes are moist.  ?   Pharynx: Posterior oropharyngeal erythema present.  ?Eyes:  ?   General: Lids are normal. Vision grossly intact.     ?   Right eye: No discharge.     ?   Left eye: No discharge.  ?   Extraocular Movements: Extraocular movements intact.  ?   Conjunctiva/sclera:  ?   Right eye: Right conjunctiva is injected.  ?   Left eye: Left conjunctiva is injected.  ?   Pupils: Pupils are equal, round, and reactive to light.  ?Cardiovascular:  ?   Rate and Rhythm: Normal rate and regular rhythm.  ?   Heart sounds: Normal heart sounds.  ?Pulmonary:  ?   Effort: Pulmonary effort is normal. No respiratory distress.  ?   Breath sounds: Normal breath sounds.  ?Musculoskeletal:  ?   Cervical back: Neck supple.  ?Skin: ?   General: Skin is warm and dry.  ?Neurological:  ?   Mental Status: He is alert.  ?Psychiatric:     ?   Mood and Affect: Mood normal.     ?   Behavior: Behavior normal.  ? ? ? ?UC Treatments / Results  ?Labs ?(all labs ordered are listed, but  only abnormal results are displayed) ?Labs Reviewed  ?POCT RAPID STREP A (OFFICE)  ? ? ?EKG ? ? ?  Radiology ?No results found. ? ?Procedures ?Procedures (including critical care time) ? ?Medications Ordered in UC ?Medications - No data to display ? ?Initial Impression / Assessment and Plan / UC Course  ?I have reviewed the triage vital signs and the nursing notes. ? ?Pertinent labs & imaging results that were available during my care of the patient were reviewed by me and considered in my medical decision making (see chart for details). ? ?Bilateral conjunctivitis, acute sinusitis.  Rapid strep negative.  Patient declines COVID test.  Treating with Augmentin.  Instructed him to continue antibiotic eyedrops as prescribed previously at another urgent care.  Discussed Tylenol or ibuprofen as needed; plain Mucinex.  Instructed him to follow-up with his PCP if his symptoms are not improving.  He agrees to plan of care. ? ? ?Final Clinical Impressions(s) / UC Diagnoses  ? ?Final diagnoses:  ?Acute conjunctivitis of both eyes, unspecified acute conjunctivitis type  ?Acute non-recurrent maxillary sinusitis  ? ? ? ?Discharge Instructions   ? ?  ?Take the antibiotic as directed.  Follow up with your primary care provider if your symptoms are not improving.   ? ? ? ? ? ? ?ED Prescriptions   ? ? Medication Sig Dispense Auth. Provider  ? amoxicillin-clavulanate (AUGMENTIN) 875-125 MG tablet Take 1 tablet by mouth every 12 (twelve) hours. 14 tablet Mickie Bail, NP  ? ?  ? ?PDMP not reviewed this encounter. ?  ?Mickie Bail, NP ?12/21/21 1305 ? ?

## 2022-03-21 ENCOUNTER — Encounter: Payer: Self-pay | Admitting: Medical

## 2022-03-21 DIAGNOSIS — L989 Disorder of the skin and subcutaneous tissue, unspecified: Secondary | ICD-10-CM

## 2022-09-27 ENCOUNTER — Ambulatory Visit: Payer: Medicaid Other | Admitting: Medical

## 2022-09-27 ENCOUNTER — Encounter: Payer: Self-pay | Admitting: Medical

## 2022-09-27 ENCOUNTER — Other Ambulatory Visit: Payer: Self-pay | Admitting: Medical

## 2022-09-27 VITALS — BP 140/85 | HR 100 | Temp 98.2°F | Resp 18 | Ht 73.0 in | Wt 209.2 lb

## 2022-09-27 DIAGNOSIS — J0100 Acute maxillary sinusitis, unspecified: Secondary | ICD-10-CM | POA: Diagnosis not present

## 2022-09-27 DIAGNOSIS — G43809 Other migraine, not intractable, without status migrainosus: Secondary | ICD-10-CM

## 2022-09-27 DIAGNOSIS — R0981 Nasal congestion: Secondary | ICD-10-CM | POA: Diagnosis not present

## 2022-09-27 MED ORDER — FLUTICASONE PROPIONATE 50 MCG/ACT NA SUSP: 2.0000 | Freq: Every day | NASAL | 1 refills | Status: DC

## 2022-09-27 MED ORDER — SUMATRIPTAN SUCCINATE 50 MG PO TABS: 50.0000 mg | ORAL_TABLET | ORAL | 0 refills | Status: DC | PRN

## 2022-09-27 MED ORDER — TOPIRAMATE 25 MG PO TABS: 25.0000 mg | ORAL_TABLET | Freq: Every day | ORAL | 3 refills | Status: AC

## 2022-09-27 MED ORDER — AZITHROMYCIN 250 MG PO TABS: ORAL_TABLET | ORAL | 0 refills | Status: AC

## 2022-09-27 NOTE — Patient Instructions (Addendum)
Migraine ha hx with recent more recurrent post covid. Rx topamax for prevention and rx imitrex to use early onnset migraine ha. Can also use excedrin migraine if needed.   With strict work abscense policy let me know if headaches persisting/reoccuring.  Bp borderline but sudafed recently. Recommend stop sudafed.  Nasal congestion and blowing out mucus from nose post covid. Rx flonase and azithromycin for possible sinus infection.   Follow up in 2-3 weeks or sooner if needed

## 2022-09-27 NOTE — Progress Notes (Signed)
Subjective:    Patient ID: William Villa, male    DOB: 02/22/1986, 36 y.o.   MRN: [id number]  HPI Pt in for recent ha. He states he usually gets ha in winter. This has been the case for years. He states also hx of migraines. In the past he used sumitriptan when got early ha. He states would stop ha some but not always. Nausea and light senstive with ha. When occurs will be rt side ha. Yesterday had headache but eased up this morning.   Pt also states he had covid on 09-07-22. He had severe nasal congestion and he states had headache. Pt took prednisone course of 6 days. He had that left over from orthopedist. This was second time he had covid.   Some congestion and blowig out mucus to date.  Pt has concern about missing work with new job. He state his work [employer] is very strict.   Review of Systems  Constitutional:  Negative for chills, fatigue and fever.  Respiratory:  Negative for cough, chest tightness, shortness of breath and wheezing.   Cardiovascular:  Negative for chest pain and palpitations.  Gastrointestinal:  Negative for abdominal pain.  Genitourinary:  Negative for dysuria, flank pain and frequency.  Musculoskeletal:  Negative for back pain and joint swelling.  Neurological:  Positive for headaches. Negative for dizziness, seizures and numbness.  Hematological:  Negative for adenopathy. Does not bruise/bleed easily.  Psychiatric/Behavioral:  Negative for behavioral problems, decreased concentration and dysphoric mood.     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   Substance abuse (HCC)    smokes marajuana   Type 1 superior labral anterior-to-posterior (SLAP) tear of right shoulder      Social History   Socioeconomic History    Marital status: Single    Spouse name: Not on file   Number of children: 2   Years of education: Not on file   Highest education level: Not on file  Occupational History   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  Tobacco Use   Smoking status: Never   Smokeless tobacco: Never  Vaping Use   Vaping Use: Never used  Substance and Sexual Activity   Alcohol use: Yes    Comment: occasionally   Drug use: Not Currently    Types: Marijuana    Comment: last used 10/30/2019   Sexual activity: Not on file  Other Topics Concern   Not on file  Social History Narrative   Not on file   Social Determinants of Health   Financial Resource Strain: Not on file  Food Insecurity: Not on file  Transportation Needs: Not on file  Physical Activity: Not on file  Stress: Not on file  Social Connections: Not on file  Intimate Partner Violence: Not on file    Past Surgical History:  Procedure Laterality Date   SHOULDER ARTHROSCOPY WITH CAPSULORRHAPHY Right 10/03/2017   Procedure: SHOULDER ATHROSCOPY WITH CAPSULORRHAPHY, REPAIR SLAP LESION AND DEBRIDEMENT;  Surgeon: Salvatore Marvel, MD;  Location: [facility];  Service: Orthopedics;  Laterality: Right;   TONSILLECTOMY      Family History  Problem Relation Age of Onset   GER disease Mother    Anxiety disorder Mother  Depression Mother    Healthy Father    Colon cancer Maternal Uncle    Esophageal cancer Neg Hx    Rectal cancer Neg Hx    Stomach cancer Neg Hx     No Known Allergies  Current Outpatient Medications on File Prior to Visit  Medication Sig Dispense Refill   sertraline (ZOLOFT) 50 MG tablet Take 1 tablet (50 mg total) by mouth daily. 30 tablet 3   No current facility-administered medications on file prior to visit.    BP (!) 140/85   Pulse 100   Temp 98.2 F (36.8 C)   Resp 18   Ht 6\' 1"  (1.854 m)   Wt 209 lb 3.2 oz (94.9 kg)   SpO2 99%   BMI 27.60 kg/m        Objective:   Physical Exam  General Mental  Status- Alert. General Appearance- Not in acute distress.   Skin General: Color- Normal Color. Moisture- Normal Moisture.  Neck Carotid Arteries- Normal color. Moisture- Normal Moisture. No carotid bruits. No JVD.  Chest and Lung Exam Auscultation: Breath Sounds:-Normal.  Cardiovascular Auscultation:Rythm- Regular. Murmurs & Other Heart Sounds:Auscultation of the heart reveals- No Murmurs.  Neurologic Cranial Nerve exam:- CN III-XII intact(No nystagmus), symmetric smile. Finger to Nose:- Normal/Intact Strength:- 5/5 equal and symmetric strength both upper and lower extremities.       Assessment & Plan:   Patient Instructions  Migraine ha hx with recent more recurrent post covid. Rx topamax for prevention and rx imitrex to use early onnset migraine ha. Can also use excedrin migraine if needed.   With strict work abscense policy let me know if headaches persisting/reoccuring.  Bp borderline but sudafed recently. Recommend stop sudafed.  Nasal congestion and blowing out mucus from nose post covid. Rx flonase and azithromycin for possible sinus infection.   Follow up in 2-3 weeks or sooner if needed   , PA-C

## 2022-10-17 ENCOUNTER — Telehealth: Payer: Self-pay | Admitting: Medical

## 2022-10-17 MED ORDER — SUMATRIPTAN SUCCINATE 50 MG PO TABS: 50.0000 mg | ORAL_TABLET | ORAL | 0 refills | Status: AC | PRN

## 2022-10-17 NOTE — Telephone Encounter (Signed)
Rx sent 

## 2022-10-17 NOTE — Telephone Encounter (Signed)
Patient would like to know if he is able to get a refill on his SUMAtriptan (IMITREX) 50 MG tablet  medication or if he needs to follow up before he can get some more. Please advise.

## 2022-10-18 ENCOUNTER — Ambulatory Visit: Payer: Medicaid Other | Admitting: Medical

## 2023-01-31 ENCOUNTER — Other Ambulatory Visit: Payer: Self-pay | Admitting: Medical

## 2023-07-30 ENCOUNTER — Encounter (HOSPITAL_BASED_OUTPATIENT_CLINIC_OR_DEPARTMENT_OTHER): Payer: Self-pay

## 2023-07-30 ENCOUNTER — Other Ambulatory Visit: Payer: Self-pay

## 2023-07-30 ENCOUNTER — Emergency Department (HOSPITAL_BASED_OUTPATIENT_CLINIC_OR_DEPARTMENT_OTHER)
Admission: EM | Admit: 2023-07-30 | Discharge: 2023-07-30 | Disposition: A | Discharge status: Home / Self Care | Payer: Self-pay | Attending: Emergency Medicine | Admitting: Emergency Medicine

## 2023-07-30 ENCOUNTER — Emergency Department (HOSPITAL_BASED_OUTPATIENT_CLINIC_OR_DEPARTMENT_OTHER): Payer: Self-pay

## 2023-07-30 DIAGNOSIS — S61411A Laceration without foreign body of right hand, initial encounter: Secondary | ICD-10-CM | POA: Insufficient documentation

## 2023-07-30 DIAGNOSIS — W260XXA Contact with knife, initial encounter: Secondary | ICD-10-CM | POA: Insufficient documentation

## 2023-07-30 DIAGNOSIS — Z79899 Other long term (current) drug therapy: Secondary | ICD-10-CM | POA: Insufficient documentation

## 2023-07-30 DIAGNOSIS — Z23 Encounter for immunization: Secondary | ICD-10-CM | POA: Insufficient documentation

## 2023-07-30 DIAGNOSIS — Y92000 Kitchen of unspecified non-institutional (private) residence as  the place of occurrence of the external cause: Secondary | ICD-10-CM | POA: Insufficient documentation

## 2023-07-30 MED ORDER — LIDOCAINE HCL (PF) 1 % IJ SOLN
10.0000 mL | Freq: Once | INTRAMUSCULAR | Status: AC
  Administered 2023-07-30: 10 mL
  Filled 2023-07-30 (×2): qty 10

## 2023-07-30 MED ORDER — TETANUS-DIPHTH-ACELL PERTUSSIS 5-2.5-18.5 LF-MCG/0.5 IM SUSY
0.5000 mL | PREFILLED_SYRINGE | Freq: Once | INTRAMUSCULAR | Status: AC
  Administered 2023-07-30: 0.5 mL via INTRAMUSCULAR
  Filled 2023-07-30: qty 0.5

## 2023-07-30 NOTE — Discharge Instructions (Signed)
Please use Tylenol or ibuprofen for pain.  You may use 600 mg ibuprofen every 6 hours or 1000 mg of Tylenol every 6 hours.  You may choose to alternate between the 2.  This would be most effective.  Not to exceed 4 g of Tylenol within 24 hours.  Not to exceed 3200 mg ibuprofen 24 hours.  You received sutures which are not dissolvable. Please return in 7-10 for suture removal. I would place a bandage on the affected area and change it at least once daily, or more frequently if it becomes soiled.  You can leave the initial dressing replaced for 24 to 48 hours as long as it remains secure, if it becomes loose I would change it as we described above.  When changing bandage, clean the wound thoroughly with soap and water, and place a thin layer of Polysporin or Neosporin directly over top of the wound before replacing bandage. Please monitor for any signs of developing infection including worsening redness, pain, pus draining from the affected site.  If you are concerned about any developing infection I recommend that you return for further evaluation for management.

## 2023-07-30 NOTE — ED Triage Notes (Addendum)
Pt reports he cut his right palm with a kitchen knife tonight when he was carving a pumpkin tonight around 1900. Deep laceration to medial anterior palm below his pinky. + sensation, able to wiggle fingers and cap refill <3. Moderate bleeding controlled with pressure dressing. Last tetanus unknown.

## 2023-07-30 NOTE — ED Notes (Signed)
Wound irrigated at this time utilizing wound cleaner and sterile water. Patient tolerated irrigation well. Wrapped in saline gauze.

## 2023-07-30 NOTE — ED Provider Notes (Signed)
[facility] Provider Note   CSN: [id number] Arrival date & time: 07/30/23  1916     History  Chief Complaint  Patient presents with   Extremity Laceration    William Villa is a 37 y.o. male with overall noncontributory past medical history who presents concern for laceration to right palm with kitchen knife tonight while carving a pumpkin.  He reports that he has normal sensation other than some minor numbness right around the site of the laceration can move all of the fingers without difficulty.  HPI     Home Medications Prior to Admission medications   Medication Sig Start Date End Date Taking? Authorizing Provider  fluticasone (FLONASE) 50 MCG/ACT nasal spray SHAKE LIQUID AND USE 2 SPRAYS IN [facility] NOSTRIL DAILY 09/27/22   Saguier, Ramon Dredge, PA-C  sertraline (ZOLOFT) 50 MG tablet Take 1 tablet (50 mg total) by mouth daily. 01/08/21   Saguier, Ramon Dredge, PA-C  SUMAtriptan (IMITREX) 50 MG tablet Take 1 tablet (50 mg total) by mouth every 2 (two) hours as needed for migraine. May repeat in 2 hours if headache persists or recurs. 10/17/22   Saguier, Ramon Dredge, PA-C  topiramate (TOPAMAX) 25 MG tablet Take 1 tablet (25 mg total) by mouth daily. 09/27/22   Saguier, Ramon Dredge, PA-C      Allergies    Patient has no known allergies.    Review of Systems   Review of Systems  All other systems reviewed and are negative.   Physical Exam Updated Vital Signs BP (!) 148/101 (BP Location: Right Arm)   Pulse 72   Temp 98.1 F (36.7 C) (Oral)   Resp 17   Ht 6\' 1"  (1.854 m)   Wt 97.5 kg   SpO2 100%   BMI 28.37 kg/m  Physical Exam Vitals and nursing note reviewed.  Constitutional:      General: He is not in acute distress.    Appearance: Normal appearance.  HENT:     Head: Normocephalic and atraumatic.  Eyes:     General:        Right eye: No discharge.        Left eye: No discharge.  Cardiovascular:     Rate and Rhythm: Normal rate and  regular rhythm.     Pulses: Normal pulses.     Comments: Radial, ulnar pulses intact, 2+ in the affected extremity Pulmonary:     Effort: Pulmonary effort is normal. No respiratory distress.  Musculoskeletal:        General: No deformity.     Comments: Intact strength to flexion, extension against resistance of all digits on the affected right hand  Skin:    General: Skin is warm and dry.     Capillary Refill: Capillary refill takes less than 2 seconds.     Comments: Patient with approximately 6 to 7 cm laceration on the palmar surface of the right hand, overlying the hypothenar eminence.  Laceration is clean, without evidence of foreign body on full exploration of wound.  No evidence of tendon laceration on exam.  Neurological:     Mental Status: He is alert and oriented to person, place, and time.  Psychiatric:        Mood and Affect: Mood normal.        Behavior: Behavior normal.     ED Results / Procedures / Treatments   Labs (all labs ordered are listed, but only abnormal results are displayed) Labs Reviewed - No data to display  EKG None  Radiology No results found.  Procedures .Marland KitchenLaceration Repair  Date/Time: 07/30/2023 9:25 PM  Performed by: Olene Floss, PA-C Authorized by: Olene Floss, PA-C   Consent:    Consent obtained:  Verbal   Consent given by:  Patient   Risks, benefits, and alternatives were discussed: yes     Risks discussed:  Infection, pain, poor cosmetic result, tendon damage, vascular damage, poor wound healing, need for additional repair and nerve damage   Alternatives discussed:  No treatment Universal protocol:    Procedure explained and questions answered to patient or proxy's satisfaction: yes     Patient identity confirmed:  Verbally with patient Anesthesia:    Anesthesia method:  Local infiltration   Local anesthetic:  Lidocaine 1% w/o epi Laceration details:    Location:  Hand   Length (cm):  6.5   Depth (mm):   5 Treatment:    Area cleansed with:  Shur-Clens   Amount of cleaning:  Standard Skin repair:    Repair method:  Sutures   Suture size:  4-0   Suture material:  Prolene   Suture technique:  Simple interrupted   Number of sutures:  8 Approximation:    Approximation:  Close Repair type:    Repair type:  Simple Post-procedure details:    Dressing:  Non-adherent dressing   Procedure completion:  Tolerated     Medications Ordered in ED Medications  lidocaine (PF) (XYLOCAINE) 1 % injection 10 mL (10 mLs Infiltration Given by Other 07/30/23 2058)  Tdap (BOOSTRIX) injection 0.5 mL (0.5 mLs Intramuscular Given 07/30/23 2052)    ED Course/ Medical Decision Making/ A&P                                 Medical Decision Making Amount and/or Complexity of Data Reviewed Radiology: ordered.  Risk Prescription drug management.   This is an overall well-appearing 37 yo male who presents with a laceration of the right hand.  The wound was explored through full range of motion, there is no evidence of foreign body, fascia rupture, damage to the deep vessels, capillary refill is intact distal to the site of the laceration.  Patient is not up-to-date on their tetanus. Tetanus updated today.  They are neurovascularly intact throughout on my exam, intact strength of the affected extremity.   Wound was extensively cleaned, and repaired as described above.  Patient tolerated the procedure without difficulty.  Nonadherent dressing was placed.  Patient instructed to return in 7-10 days for suture removal, and monitor for signs of infection including worsening pain, redness, pus draining from the affected site.  Instructed to keep the wound clean, bandage, and change the bandage at least once daily.  Patient understands and agrees to the plan, and is discharged in stable condition at this time. Final Clinical Impression(s) / ED Diagnoses Final diagnoses:  Laceration of right hand without foreign body,  initial encounter    Rx / DC Orders ED Discharge Orders     None         [place] 07/30/23 2127    Alvira Monday, MD 07/31/23 1346
